# Patient Record
Sex: Female | Born: 1990 | Race: Black or African American | Hispanic: No | Marital: Married | Smoking: Current some day smoker
Health system: Southern US, Community
[De-identification: ages and names within clinical notes are randomized; demographics above are authoritative.]

## PROBLEM LIST (undated history)

## (undated) ENCOUNTER — Inpatient Hospital Stay (HOSPITAL_COMMUNITY): Payer: Self-pay

## (undated) DIAGNOSIS — F32A Depression, unspecified: Secondary | ICD-10-CM

## (undated) DIAGNOSIS — F329 Major depressive disorder, single episode, unspecified: Secondary | ICD-10-CM

## (undated) HISTORY — PX: NO PAST SURGERIES: SHX2092

## (undated) HISTORY — DX: Major depressive disorder, single episode, unspecified: F32.9

## (undated) HISTORY — DX: Depression, unspecified: F32.A

---

## 2015-07-09 ENCOUNTER — Emergency Department (HOSPITAL_COMMUNITY)
Admission: EM | Admit: 2015-07-09 | Discharge: 2015-07-09 | Disposition: A | Discharge status: Home / Self Care | Payer: Self-pay | Attending: Emergency Medicine | Admitting: Emergency Medicine

## 2015-07-09 ENCOUNTER — Encounter (HOSPITAL_COMMUNITY): Payer: Self-pay | Admitting: Emergency Medicine

## 2015-07-09 DIAGNOSIS — R451 Restlessness and agitation: Secondary | ICD-10-CM | POA: Insufficient documentation

## 2015-07-09 DIAGNOSIS — F419 Anxiety disorder, unspecified: Secondary | ICD-10-CM | POA: Insufficient documentation

## 2015-07-09 NOTE — ED Provider Notes (Signed)
CSN: [id number]     Arrival date & time 07/09/15  [id number] History   First MD Initiated Contact with Patient 07/09/15 0848     Chief Complaint  Patient presents with  . eval of feelings      (Consider location/radiation/quality/duration/timing/severity/associated sxs/prior Treatment) Patient is a 24 y.o. female presenting with altered mental status. The history is provided by the patient (Patient states that she has been anxious dealing with her husband.).  Altered Mental Status Presenting symptoms: no behavior changes and no combativeness   Severity:  Mild Most recent episode:  Today Timing:  Constant Progression:  Waxing and waning Chronicity:  New Context: not alcohol use   Associated symptoms: agitation   Associated symptoms: no abdominal pain, no hallucinations, no headaches, no rash and no seizures     History reviewed. No pertinent past medical history. History reviewed. No pertinent past surgical history. No family history on file. Social History  Substance Use Topics  . Smoking status: Never Smoker   . Smokeless tobacco: None  . Alcohol Use: No   OB History    No data available     Review of Systems  Constitutional: Negative for appetite change and fatigue.  HENT: Negative for congestion, ear discharge and sinus pressure.   Eyes: Negative for discharge.  Respiratory: Negative for cough.   Cardiovascular: Negative for chest pain.  Gastrointestinal: Negative for abdominal pain and diarrhea.  Genitourinary: Negative for frequency and hematuria.  Musculoskeletal: Negative for back pain.  Skin: Negative for rash.  Neurological: Negative for seizures and headaches.  Psychiatric/Behavioral: Positive for agitation. Negative for hallucinations.      Allergies  Review of patient's allergies indicates no known allergies.  Home Medications   Prior to Admission medications   Not on File   BP 155/93 mmHg  Pulse 114  Temp(Src) 98.6 F (37 C) (Oral)  Resp 20   SpO2 99%  LMP 06/25/2015 Physical Exam  Constitutional: She is oriented to person, place, and time. She appears well-developed.  HENT:  Head: Normocephalic.  Eyes: Conjunctivae and EOM are normal. No scleral icterus.  Neck: Neck supple. No thyromegaly present.  Cardiovascular: Normal rate and regular rhythm.  Exam reveals no gallop and no friction rub.   No murmur heard. Pulmonary/Chest: No stridor. She has no wheezes. She has no rales. She exhibits no tenderness.  Abdominal: She exhibits no distension. There is no tenderness. There is no rebound.  Musculoskeletal: Normal range of motion. She exhibits no edema.  Lymphadenopathy:    She has no cervical adenopathy.  Neurological: She is oriented to person, place, and time. She exhibits normal muscle tone. Coordination normal.  Skin: No rash noted. No erythema.  Psychiatric:  Anxiety mild. Patient not suicidal or homicidal    ED Course  Procedures (including critical care time) Labs Review Labs Reviewed - No data to display  Imaging Review No results found. I have personally reviewed and evaluated these images and lab results as part of my medical decision-making.   EKG Interpretation None      MDM   Final diagnoses:  Anxiety    Patient with anxiety. She will be referred to behavioral outpatient. Patient's husband is having a manic episode and will be evaluated    Bethann BerkshireJoseph Mazin Emma, MD 07/09/15 [phone]

## 2015-07-09 NOTE — ED Notes (Signed)
Patient is here wanting evaluation of feelings-husband is here for eval as well-states husband wants to save the world and it is causing her confusion

## 2015-07-09 NOTE — Discharge Instructions (Signed)
Follow up with out pt counseling Substance Abuse Treatment Programs  Intensive Outpatient Programs [facility]     [address]      [place], [place]                   [phone]       [facility] [address] [place], [place] [phone]  [facility]     (Inpatient and outpatient)     [address]           [phone]    [facility] [phone] (Suboxone and Methadone)  [address]      [place], [place] [address]      [phone]       [address] [place], [place] [phone]  [facility] (Outpatient/Inpatient, Chemical)    (insurance only) [phone]             [facility] (Groups & Residential) [place], [place] [phone]     [facility]     [address]     [place], [place]      [phone]       [facility] (for caregivers and family) [phone] [address] [place], [place] [phone]      Residential Treatment Programs [facility]      [address], [place], [place] [address]  [phone]       [facility] [address] [place], [place] [address] [phone]  [facility]        [phone]       [facility] [phone]  [facility] ([facility]             [address]                                         [place], [place]                                                [phone] or [phone]                               [facility] [place] [address] [place], [address] [phone]  [facility]    [address]      [place], [place]     [phone]       [facility] [address] [place], [place] [phone]  [facility]   [address]     [place], [place] [address]     [phone]      Admissions: 8am-3pm M-F  [facility] ([facility]) [address] [place], [place] [phone]  [facility]: Residential Program [phone](90 Days)   [place], [place]      [phone] or [phone]     [facility]: [facility] [place], [place] (Walk in Hours over the weekend or by referral)  [facility] [address] [place], [place], [place] [address] [phone]  Crisis Mobile: [facility]:  [phone] (for crisis response 24 hours a day) [facility] Hotline:      ([phone] Outpatient Psychiatry and  Counseling  [facility]: Mobile Crisis Management 24 hours:  [phone]  [facility] of the [employer] sliding scale fee and walk in schedule: M-F 8am-12pm/1pm-3pm [address]  [place], [place] [address] [phone]  [facility] [address] [place], [place] [address] [phone]  [facility] ([facility] [employer])- new patient walk-in appointments available Monday - Friday 8am -3pm.          [address] [place], [place] [address] [phone] or crisis line- [phone]  [facility]/ Intensive Outpatient Therapy Program [address] [place], [place] [address] [phone]  [facility]                  Crisis Services      [phone] [address]     [place], [place] [address]                 [place] [facility]   [facility] [phone]. [address] [place], [place] [address]   [employer] of Care          [address] [place]  [place], [place] [address]       [phone]  [facility] [address] [place], [place] [address] [phone]  [facility]    [address]    [place], [place] [address]     [phone]       Andee Poles, MD     [address]     [place] [address]     [phone]       [facility] [address] [place] [address]  [facility]     [address] [place], [place]      [phone]       [facility] E[facility]io Ditch, MD [address] [place], [place] [address] [phone]  [facility]     [address]     [place], [place] [address]     [phone]       [facility] [address] [place], [place] [address] [phone] Resources for Temporary Residential Assistance/Crisis Centers  DAY CENTERS [facility]) M-F 8am-3pm   [address] [place], [place] [address]   [phone] Services include: laundry, barbering, support groups, case management, phone  & computer access, showers, AA/NA mtgs, mental health/substance abuse nurse, job skills class, disability information, VA assistance, spiritual classes, etc.   HOMELESS SHELTERS  [facility]     [employer] [facility]   [address] [place]     [phone]              [employer] (women and children)       [address] [place], [place] [address] [phone] [email or web address] for application and process Application Required  [facility] [employer] [facility]   [address]    [place] [address]     [phone]                    [facility] [address] [place], [place] [address] [phone] application appt.) Application Required  [facility] (women only)    [address]     [place], [place] [address]  [phone]      Intake starts 6pm daily Need valid ID, SSC, & Police report [employer] [address] [place], [place] [phone] Application Required  [employer] (men only)     [place].      [place], [place]       [place] (Pregnant women only) [address] [place],  [place]  [facility]      [place] [address]      [place], [place] [address]     [phone]             [facility] [address] [place], [place] 90 day commitment/SA/Application process  [facility](men only)     [address]     [place], [place]       Check-in at [facility] [address] [place], [place] [address] [phone] Men/Women/Women and Children must be there by 7 pm  [place], [place]

## 2016-08-16 NOTE — L&D Delivery Note (Cosign Needed)
Delivery Note Pt began to bear down involuntarily and at approx 0312 she was noted to be complete with membrane present through the introitus. She pushed with the next two contractions and at 3:21 AM a viable female was delivered via Vaginal, Spontaneous Delivery (Presentation: en caul; ROP).  APGAR: 8,9 ; weight 5 lb 1.1 oz (2300 g).  Infant briefly dried; cord clamped and cut by CNM and infant to awaiting peds team. Placenta status: spont, intact .  Cord: 3 vessel  Anesthesia:  None Episiotomy: None Lacerations: None Est. Blood Loss (mL): 200  Mom to postpartum.  Baby to NICU.  Jamie Stevenson, Jamie Stevenson CNM 09/11/2016, 3:40 AM

## 2016-08-18 ENCOUNTER — Encounter (HOSPITAL_COMMUNITY): Payer: Self-pay | Admitting: *Deleted

## 2016-08-18 ENCOUNTER — Inpatient Hospital Stay (HOSPITAL_COMMUNITY)
Admission: AD | Admit: 2016-08-18 | Discharge: 2016-08-18 | Disposition: A | Discharge status: Home / Self Care | Payer: Self-pay | Source: Ambulatory Visit | Attending: Obstetrics & Gynecology | Admitting: Obstetrics & Gynecology

## 2016-08-18 DIAGNOSIS — Z711 Person with feared health complaint in whom no diagnosis is made: Secondary | ICD-10-CM

## 2016-08-18 DIAGNOSIS — O0933 Supervision of pregnancy with insufficient antenatal care, third trimester: Secondary | ICD-10-CM

## 2016-08-18 DIAGNOSIS — Z3A29 29 weeks gestation of pregnancy: Secondary | ICD-10-CM | POA: Insufficient documentation

## 2016-08-18 DIAGNOSIS — O99333 Smoking (tobacco) complicating pregnancy, third trimester: Secondary | ICD-10-CM | POA: Insufficient documentation

## 2016-08-18 LAB — URINALYSIS, ROUTINE W REFLEX MICROSCOPIC
Bilirubin Urine: NEGATIVE
Glucose, UA: NEGATIVE mg/dL
Ketones, ur: NEGATIVE mg/dL
Leukocytes, UA: NEGATIVE
Nitrite: NEGATIVE
PROTEIN: 100 mg/dL — AB
SPECIFIC GRAVITY, URINE: 1.016 (ref 1.005–1.030)
pH: 7 (ref 5.0–8.0)

## 2016-08-18 MED ORDER — PRENATAL VITAMINS 0.8 MG PO TABS: 1.0000 | ORAL_TABLET | Freq: Every day | ORAL | 12 refills | Status: AC

## 2016-08-18 NOTE — MAU Note (Signed)
Waiting on ins, has not been seen anywhere yet.  Not having any problems. Wanting to get checked out.

## 2016-08-18 NOTE — MAU Provider Note (Signed)
Chief Complaint:  check up   First Provider Initiated Contact with Patient 08/18/16 1925     HPI: Jamie Stevenson is a 26 y.o. G2P1001 at 3129w0dwho presents to maternity admissions reporting pregnancy with no prenatal care yet.  Wants to make sure baby is OK.  Unsure of dates. Had IC in June and not again until August.  Thinks she got pregnant in August. Seems surprised it could have been June.  Took Morning after pill in June.. She reports good fetal movement, denies LOF, vaginal bleeding, vaginal itching/burning, urinary symptoms, h/a, dizziness, n/v, diarrhea, constipation or fever/chills.  She denies headache, visual changes or RUQ abdominal pain.  Other  This is a new problem. The current episode started more than 1 month ago. The problem has been unchanged. Pertinent negatives include no abdominal pain, change in bowel habit, chest pain, chills, congestion, coughing, fatigue, fever, myalgias, nausea, urinary symptoms, vertigo, visual change or vomiting. Nothing aggravates the symptoms. She has tried nothing for the symptoms.   RN Note: Waiting on ins, has not been seen anywhere yet.  Not having any problems. Wanting to get checked out  Past Medical History: History reviewed. No pertinent past medical history.  Past obstetric history: OB History  Gravida Para Term Preterm AB Living  2 1 1     1   SAB TAB Ectopic Multiple Live Births          1    # Outcome Date GA Lbr Len/2nd Weight Sex Delivery Anes PTL Lv  2 Current           1 Term 2010     Vag-Spont   LIV      Past Surgical History: History reviewed. No pertinent surgical history.  Family History: History reviewed. No pertinent family history.  Social History: Social History  Substance Use Topics  . Smoking status: Current Every Day Smoker    Types: Cigarettes  . Smokeless tobacco: Former NeurosurgeonUser  . Alcohol use No    Allergies: No Known Allergies  Meds:  No prescriptions prior to admission.    I have reviewed  patient's Past Medical Hx, Surgical Hx, Family Hx, Social Hx, medications and allergies.   ROS:  Review of Systems  Constitutional: Negative for chills, fatigue and fever.  HENT: Negative for congestion.   Respiratory: Negative for cough.   Cardiovascular: Negative for chest pain.  Gastrointestinal: Negative for abdominal pain, change in bowel habit, nausea and vomiting.  Musculoskeletal: Negative for myalgias.  Neurological: Negative for vertigo.   Other systems negative  Physical Exam  Patient Vitals for the past 24 hrs:  BP Temp Temp src Pulse Resp SpO2 Weight  08/18/16 1855 - - - 106 - 99 % -  08/18/16 1853 124/72 98.3 F (36.8 C) Oral (!) 127 16 - 133 lb 6.4 oz (60.5 kg)   Constitutional: Well-developed, well-nourished female in no acute distress.  Cardiovascular: normal rate and rhythm Respiratory: normal effort, clear to auscultation bilaterally GI: Abd soft, non-tender, gravid appropriate for gestational age.   No rebound or guarding.       Fundal height 28cm MS: Extremities nontender, no edema, normal ROM Neurologic: Alert and oriented x 4.  GU: Neg CVAT.  PELVIC EXAM:  not indicated  FHT:  Baseline 140 , moderate variability, accelerations present, no decelerations Contractions:  Rare   Labs:    Results for orders placed or performed during the hospital encounter of 08/18/16 (from the past 72 hour(s))  Urinalysis, Routine w reflex microscopic  Status: Abnormal   Collection Time: 08/18/16  6:55 PM  Result Value Ref Range   Color, Urine YELLOW YELLOW   APPearance CLOUDY (A) CLEAR   Specific Gravity, Urine 1.016 1.005 - 1.030   pH 7.0 5.0 - 8.0   Glucose, UA NEGATIVE NEGATIVE mg/dL   Hgb urine dipstick MODERATE (A) NEGATIVE   Bilirubin Urine NEGATIVE NEGATIVE   Ketones, ur NEGATIVE NEGATIVE mg/dL   Protein, ur 409 (A) NEGATIVE mg/dL   Nitrite NEGATIVE NEGATIVE   Leukocytes, UA NEGATIVE NEGATIVE   RBC / HPF 6-30 0 - 5 RBC/hpf   WBC, UA 6-30 0 - 5 WBC/hpf    Bacteria, UA RARE (A) NONE SEEN   Squamous Epithelial / LPF 0-5 (A) NONE SEEN   Mucous PRESENT      Imaging:  Informal bedside US done Head circumference and abdominal circumference done which measure 28-29 weeks  MAU Course/MDM: I have ordered labs and reviewed results. UA normal  Other labs to be done at new ob appt NST reviewed   Assessment: Single IUP at [redacted]w[redacted]d No prenatal care No obvious abnormalities  Plan: Discharge home Preterm Labor precautions and fetal kick counts Follow up in Office for prenatal visits   Encouraged to return here or to other Urgent Care/ED if she develops worsening of symptoms, increase in pain, fever, or other concerning symptoms.   Pt stable at time of discharge.  Wynelle Bourgeois CNM, MSN Certified Nurse-Midwife 08/18/2016 7:25 PM

## 2016-08-18 NOTE — Discharge Instructions (Signed)
Prenatal Care WHAT IS PRENATAL CARE? Prenatal care is the process of caring for a pregnant woman before she gives birth. Prenatal care makes sure that she and her baby remain as healthy as possible throughout pregnancy. Prenatal care may be provided by a midwife, family practice health care provider, or a childbirth and pregnancy specialist (obstetrician). Prenatal care may include physical examinations, testing, treatments, and education on nutrition, lifestyle, and social support services. WHY IS PRENATAL CARE SO IMPORTANT? Early and consistent prenatal care increases the chance that you and your baby will remain healthy throughout your pregnancy. This type of care also decreases a babys risk of being born too early (prematurely), or being born smaller than expected (small for gestational age). Any underlying medical conditions you may have that could pose a risk during your pregnancy are discussed during prenatal care visits. You will also be monitored regularly for any new conditions that may arise during your pregnancy so they can be treated quickly and effectively. WHAT HAPPENS DURING PRENATAL CARE VISITS? Prenatal care visits may include the following: Discussion Tell your health care provider about any new signs or symptoms you have experienced since your last visit. These might include:  Nausea or vomiting.  Increased or decreased level of energy.  Difficulty sleeping.  Back or leg pain.  Weight changes.  Frequent urination.  Shortness of breath with physical activity.  Changes in your skin, such as the development of a rash or itchiness.  Vaginal discharge or bleeding.  Feelings of excitement or nervousness.  Changes in your babys movements. You may want to write down any questions or topics you want to discuss with your health care provider and bring them with you to your appointment. Examination During your first prenatal care visit, you will likely have a complete  physical exam. Your health care provider will often examine your vagina, cervix, and the position of your uterus, as well as check your heart, lungs, and other body systems. As your pregnancy progresses, your health care provider will measure the size of your uterus and your babys position inside your uterus. He or she may also examine you for early signs of labor. Your prenatal visits may also include checking your blood pressure and, after about 10-12 weeks of pregnancy, listening to your babys heartbeat. Testing Regular testing often includes:  Urinalysis. This checks your urine for glucose, protein, or signs of infection.  Blood count. This checks the levels of white and red blood cells in your body.  Tests for sexually transmitted infections (STIs). Testing for STIs at the beginning of pregnancy is routinely done and is required in many states.  Antibody testing. You will be checked to see if you are immune to certain illnesses, such as rubella, that can affect a developing fetus.  Glucose screen. Around 24-28 weeks of pregnancy, your blood glucose level will be checked for signs of gestational diabetes. Follow-up tests may be recommended.  Group B strep. This is a bacteria that is commonly found inside a womans vagina. This test will inform your health care provider if you need an antibiotic to reduce the amount of this bacteria in your body prior to labor and childbirth.  Ultrasound. Many pregnant women undergo an ultrasound screening around 18-20 weeks of pregnancy to evaluate the health of the fetus and check for any developmental abnormalities.  HIV (human immunodeficiency virus) testing. Early in your pregnancy, you will be screened for HIV. If you are at high risk for HIV, this test may be  repeated during your third trimester of pregnancy. You may be offered other testing based on your age, personal or family medical history, or other factors. HOW OFTEN SHOULD I PLAN TO SEE MY  HEALTH CARE PROVIDER FOR PRENATAL CARE? Your prenatal care check-up schedule depends on any medical conditions you have before, or develop during, your pregnancy. If you do not have any underlying medical conditions, you will likely be seen for checkups:  Monthly, during the first 6 months of pregnancy.  Twice a month during months 7 and 8 of pregnancy.  Weekly starting in the 9th month of pregnancy and until delivery. If you develop signs of early labor or other concerning signs or symptoms, you may need to see your health care provider more often. Ask your health care provider what prenatal care schedule is best for you. WHAT CAN I DO TO KEEP MYSELF AND MY BABY AS HEALTHY AS POSSIBLE DURING MY PREGNANCY?  Take a prenatal vitamin containing 400 micrograms (0.4 mg) of folic acid every day. Your health care provider may also ask you to take additional vitamins such as iodine, vitamin D, iron, copper, and zinc.  Take 1500-2000 mg of calcium daily starting at your 20th week of pregnancy until you deliver your baby.  Make sure you are up to date on your vaccinations. Unless directed otherwise by your health care provider:  You should receive a tetanus, diphtheria, and pertussis (Tdap) vaccination between the 27th and 36th week of your pregnancy, regardless of when your last Tdap immunization occurred. This helps protect your baby from whooping cough (pertussis) after he or she is born.  You should receive an annual inactivated influenza vaccine (IIV) to help protect you and your baby from influenza. This can be done at any point during your pregnancy.  Eat a well-rounded diet that includes:  Fresh fruits and vegetables.  Lean proteins.  Calcium-rich foods such as milk, yogurt, hard cheeses, and dark, leafy greens.  Whole grain breads.  Do noteat seafood high in mercury, including:  Swordfish.  Tilefish.  Shark.  King mackerel.  More than 6 oz tuna per week.  Do not eat:  Raw  or undercooked meats or eggs.  Unpasteurized foods, such as soft cheeses (brie, blue, or feta), juices, and milks.  Lunch meats.  Hot dogs that have not been heated until they are steaming.  Drink enough water to keep your urine clear or pale yellow. For many women, this may be 10 or more 8 oz glasses of water each day. Keeping yourself hydrated helps deliver nutrients to your baby and may prevent the start of pre-term uterine contractions.  Do not use any tobacco products including cigarettes, chewing tobacco, or electronic cigarettes. If you need help quitting, ask your health care provider.  Do not drink beverages containing alcohol. No safe level of alcohol consumption during pregnancy has been determined.  Do not use any illegal drugs. These can harm your developing baby or cause a miscarriage.  Ask your health care provider or pharmacist before taking any prescription or over-the-counter medicines, herbs, or supplements.  Limit your caffeine intake to no more than 200 mg per day.  Exercise. Unless told otherwise by your health care provider, try to get 30 minutes of moderate exercise most days of the week. Do not  do high-impact activities, contact sports, or activities with a high risk of falling, such as horseback riding or downhill skiing.  Get plenty of rest.  Avoid anything that raises your body temperature, such  as hot tubs and saunas.  If you own a cat, do not empty its litter box. Bacteria contained in cat feces can cause an infection called toxoplasmosis. This can result in serious harm to the fetus.  Stay away from chemicals such as insecticides, lead, mercury, and cleaning or paint products that contain solvents.  Do not have any X-rays taken unless medically necessary.  Take a childbirth and breastfeeding preparation class. Ask your health care provider if you need a referral or recommendation. This information is not intended to replace advice given to you by your  health care provider. Make sure you discuss any questions you have with your health care provider. Document Released: 08/05/2003 Document Revised: 01/05/2016 Document Reviewed: 10/17/2013 Elsevier Interactive Patient Education  2017 [employer]. Third Trimester of Pregnancy The third trimester is from week 29 through week 42, months 7 through 9. This trimester is when your unborn baby (fetus) is growing very fast. At the end of the ninth month, the unborn baby is about 20 inches in length. It weighs about 6-10 pounds. Follow these instructions at home:  Avoid all smoking, herbs, and alcohol. Avoid drugs not approved by your doctor.  Do not use any tobacco products, including cigarettes, chewing tobacco, and electronic cigarettes. If you need help quitting, ask your doctor. You may get counseling or other support to help you quit.  Only take medicine as told by your doctor. Some medicines are safe and some are not during pregnancy.  Exercise only as told by your doctor. Stop exercising if you start having cramps.  Eat regular, healthy meals.  Wear a good support bra if your breasts are tender.  Do not use hot tubs, steam rooms, or saunas.  Wear your seat belt when driving.  Avoid raw meat, uncooked cheese, and liter boxes and soil used by cats.  Take your prenatal vitamins.  Take 1500-2000 milligrams of calcium daily starting at the 20th week of pregnancy until you deliver your baby.  Try taking medicine that helps you poop (stool softener) as needed, and if your doctor approves. Eat more fiber by eating fresh fruit, vegetables, and whole grains. Drink enough fluids to keep your pee (urine) clear or pale yellow.  Take warm water baths (sitz baths) to soothe pain or discomfort caused by hemorrhoids. Use hemorrhoid cream if your doctor approves.  If you have puffy, bulging veins (varicose veins), wear support hose. Raise (elevate) your feet for 15 minutes, 3-4 times a day. Limit salt  in your diet.  Avoid heavy lifting, wear low heels, and sit up straight.  Rest with your legs raised if you have leg cramps or low back pain.  Visit your dentist if you have not gone during your pregnancy. Use a soft toothbrush to brush your teeth. Be gentle when you floss.  You can have sex (intercourse) unless your doctor tells you not to.  Do not travel far distances unless you must. Only do so with your doctor's approval.  Take prenatal classes.  Practice driving to the hospital.  Pack your hospital bag.  Prepare the baby's room.  Go to your doctor visits. Get help if:  You are not sure if you are in labor or if your water has broken.  You are dizzy.  You have mild cramps or pressure in your lower belly (abdominal).  You have a nagging pain in your belly area.  You continue to feel sick to your stomach (nauseous), throw up (vomit), or have watery poop (diarrhea).  You have bad smelling fluid coming from your vagina.  You have pain with peeing (urination). Get help right away if:  You have a fever.  You are leaking fluid from your vagina.  You are spotting or bleeding from your vagina.  You have severe belly cramping or pain.  You lose or gain weight rapidly.  You have trouble catching your breath and have chest pain.  You notice sudden or extreme puffiness (swelling) of your face, hands, ankles, feet, or legs.  You have not felt the baby move in over an hour.  You have severe headaches that do not go away with medicine.  You have vision changes. This information is not intended to replace advice given to you by your health care provider. Make sure you discuss any questions you have with your health care provider. Document Released: 10/27/2009 Document Revised: 01/08/2016 Document Reviewed: 10/03/2012 Elsevier Interactive Patient Education  2017 [employer]

## 2016-08-23 ENCOUNTER — Encounter: Payer: Self-pay | Admitting: Advanced Practice Midwife

## 2016-08-23 ENCOUNTER — Encounter: Payer: Self-pay | Admitting: General Practice

## 2016-09-09 ENCOUNTER — Ambulatory Visit (INDEPENDENT_AMBULATORY_CARE_PROVIDER_SITE_OTHER): Payer: No Typology Code available for payment source | Admitting: Student

## 2016-09-09 ENCOUNTER — Encounter: Payer: Self-pay | Admitting: Student

## 2016-09-09 ENCOUNTER — Ambulatory Visit (INDEPENDENT_AMBULATORY_CARE_PROVIDER_SITE_OTHER): Payer: Self-pay | Admitting: Clinical

## 2016-09-09 VITALS — BP 124/77 | HR 115 | Ht 61.0 in | Wt 131.6 lb

## 2016-09-09 DIAGNOSIS — F329 Major depressive disorder, single episode, unspecified: Secondary | ICD-10-CM | POA: Insufficient documentation

## 2016-09-09 DIAGNOSIS — Z113 Encounter for screening for infections with a predominantly sexual mode of transmission: Secondary | ICD-10-CM

## 2016-09-09 DIAGNOSIS — F322 Major depressive disorder, single episode, severe without psychotic features: Secondary | ICD-10-CM

## 2016-09-09 DIAGNOSIS — Z3483 Encounter for supervision of other normal pregnancy, third trimester: Secondary | ICD-10-CM | POA: Diagnosis not present

## 2016-09-09 DIAGNOSIS — Z3490 Encounter for supervision of normal pregnancy, unspecified, unspecified trimester: Secondary | ICD-10-CM | POA: Insufficient documentation

## 2016-09-09 LAB — POCT URINALYSIS DIP (DEVICE)
BILIRUBIN URINE: NEGATIVE
Glucose, UA: NEGATIVE mg/dL
Ketones, ur: NEGATIVE mg/dL
Leukocytes, UA: NEGATIVE
NITRITE: NEGATIVE
Protein, ur: 100 mg/dL — AB
Specific Gravity, Urine: 1.02 (ref 1.005–1.030)
Urobilinogen, UA: 2 mg/dL — ABNORMAL HIGH (ref 0.0–1.0)
pH: 7 (ref 5.0–8.0)

## 2016-09-09 NOTE — Progress Notes (Signed)
   Subjective:    Jamie LernerDeja Stevenson is a G3P1011 at 1567w1d by LMP being seen today for her first obstetrical visit.  Her obstetrical history is significant for late to prenatal care. Patient does intend to breast feed. Pregnancy history fully reviewed.  Patient reports no complaints. Patient is teary and anxious and asking to speak to Jamie Stevenson when her OB visit is over.   Vitals:   09/09/16 1452 09/09/16 1457  BP: 124/77   Pulse: (!) 115   Weight: 131 lb 9.6 oz (59.7 kg)   Height:  5\' 1"  (1.549 m)    HISTORY: OB History  Gravida Para Term Preterm AB Living  3 1 1   1 1   SAB TAB Ectopic Multiple Live Births          1    # Outcome Date GA Lbr Len/2nd Weight Sex Delivery Anes PTL Lv  3 Current           2 AB 2014          1 Term 2010     Vag-Spont   LIV     Past Medical History:  Diagnosis Date  . Depression    No past surgical history on file. Family History  Problem Relation Age of Onset  . Diabetes Mother      Exam    Uterus:  Fundal Height: 32 cm  Pelvic Exam:    Perineum: No Hemorrhoids   Vulva: normal   Vagina:  normal mucosa, normal discharge   pH:    Cervix: long, closed, thick. No CMT. No lesions.    Adnexa: normal adnexa   Bony Pelvis: average  System: Breast:  normal appearance, no masses or tenderness   Skin: normal coloration and turgor, no rashes    Neurologic: oriented, normal, normal mood   Extremities: normal strength, tone, and muscle mass   HEENT PERRLA   Mouth/Teeth mucous membranes moist, pharynx normal without lesions   Neck supple   Cardiovascular: regular rate and rhythm   Respiratory:  appears well, vitals normal, no respiratory distress, acyanotic, normal RR, ear and throat exam is normal, neck free of mass or lymphadenopathy, chest clear, no wheezing, crepitations, rhonchi, normal symmetric air entry   Abdomen: soft, non-tender; bowel sounds normal; no masses,  no organomegaly   Urinary: urethral meatus normal      Assessment:    Pregnancy: [id number] Patient Active Problem List   Diagnosis Date Noted  . Supervision of low-risk pregnancy 09/09/2016  . Depression, major, single episode 09/09/2016        Plan:     Initial labs drawn. 2 hour GTT scheduled for 09/14/2016. Prenatal vitamins. Problem list reviewed and updated. Genetic Screening discussed : too late to care.  Ultrasound discussed; fetal survey: scheduled for 09/16/2016.  Follow up in 2 weeks. Patient spoke with Jamie Stevenson at this visit, and will see Jamie Stevenson in two weeks as well to discuss interpersonal conflict and anxiety around pregnancy and husband's reaction to her gestational age.  50% of 30 min visit spent on counseling and coordination of care.    Charlesetta GaribaldiKathryn [facility] 09/09/2016

## 2016-09-09 NOTE — Progress Notes (Signed)
Here for initial prenatal visit. C/o being sick for about a week with a mostly dry cough, congestion, headaches,but feels like it is getting better. Declines flu and tdap shots until next visit when not sick.  Signed up for [employer] App/

## 2016-09-09 NOTE — BH Specialist Note (Signed)
Session Start time: 4:00   End Time: 4:30 Total Time:  30 minutes Type of Service: Behavioral Health - Individual/Family Interpreter: No.   Interpreter Name & Language: n/a # [facility]  SUBJECTIVE: Jamie Stevenson is a 26 y.o. female  Pt. was referred by Jamie KitchensKathryn Stevenson, CNM for:  anxiety and depression. Pt. reports the following symptoms/concerns: Pt states her primary concern is worry over husband not knowing that he is not father of her baby; open to strategy to cope with overwhelming stress and lack of sleep until next visit. Pt also concern for husband, who is not being treated for schizophrenia. Duration of problem: Less than 6 months Severity: severe Previous treatment: none  OBJECTIVE: Mood: Anxious and Depressed & Affect: Tearful Risk of harm to self or others: No known risk of harm to self or others, pt denies SI today, pt admits fleeting thoughts of dying, but not suicidal thoughts; no HI, no plan Assessments administered: PHQ9: 24/ GAD7: 21  LIFE CONTEXT:  Family & Social: Lives with husband and 7yo; supportive family  (Who,family proximity, relationship, friends) Product/process development scientistchool/ Work: undetermined (Where, how often, or financial support) Self-Care: Fear and worry have prevented self-care (Exercise, sleep, eat, substances) Life changes: Current pregnancy, reunited with husband in past 4.5 months What is important to pt/family (values): Overall wellbeing of entire family  GOALS ADDRESSED:  -Reduce symptoms of anxiety and depression -Safety  INTERVENTIONS: Solution Focused and Meditation: CALM relaxation breathing exercise   ASSESSMENT:  Pt currently experiencing Major depressive disorder, without psychotic features, severe.  Pt may benefit from community resources, safety planning, psychoeducation and brief therapeutic intervention regarding coping with symptoms of anxiety with panic attack and anxiety.   PLAN: 1. F/U with behavioral health clinician:  Two weeks, or earlier, as needed 2. Behavioral Health meds: none 3. Behavioral recommendations:  -Practice CALM relaxation breathing exercise twice daily, at breakfast, and at bedtime -Consider apps provided in office visit for additional self-care strategies -Consider [facility] family-to-family classes within one month -Consider [facility] for individual, couples, and family therapy for self and husband -Scientist, product/process developmentead educational material regarding coping with symptoms of anxiety with panic attack and depression 4. Referral: Brief Counseling/Psychotherapy, Publishing rights managerCommunity Resource, Problem-solving teaching/coping strategies, Psychoeducation and Referral to [facility] provider 5. From scale of 1-10, how likely are you to follow plan: 9  Rae LipsJamie C Rubert Stevenson LCSWA Behavioral Health Clinician  Warmhandoff:   Warm Hand Off Completed.        Depression screen PHQ 2/9 09/09/2016  Decreased Interest 3  Down, Depressed, Hopeless 3  PHQ - 2 Score 6  Altered sleeping 3  Tired, decreased energy 3  Change in appetite 3  Feeling bad or failure about yourself  3  Trouble concentrating 2  Moving slowly or fidgety/restless 2  Suicidal thoughts 2  PHQ-9 Score 24   GAD 7 : Generalized Anxiety Score 09/09/2016  Nervous, Anxious, on Edge 3  Control/stop worrying 3  Worry too much - different things 3  Trouble relaxing 3  Restless 3  Easily annoyed or irritable 3  Afraid - awful might happen 3  Total GAD 7 Score 21

## 2016-09-10 ENCOUNTER — Encounter (HOSPITAL_COMMUNITY): Payer: Self-pay | Admitting: *Deleted

## 2016-09-10 ENCOUNTER — Inpatient Hospital Stay (HOSPITAL_COMMUNITY)
Admission: AD | Admit: 2016-09-10 | Discharge: 2016-09-13 | DRG: 775 | Disposition: A | Discharge status: Home / Self Care | Payer: No Typology Code available for payment source | Source: Ambulatory Visit | Attending: Obstetrics & Gynecology | Admitting: Obstetrics & Gynecology

## 2016-09-10 ENCOUNTER — Inpatient Hospital Stay (HOSPITAL_COMMUNITY): Payer: No Typology Code available for payment source

## 2016-09-10 DIAGNOSIS — J1100 Influenza due to unidentified influenza virus with unspecified type of pneumonia: Secondary | ICD-10-CM | POA: Diagnosis present

## 2016-09-10 DIAGNOSIS — J189 Pneumonia, unspecified organism: Secondary | ICD-10-CM

## 2016-09-10 DIAGNOSIS — O99334 Smoking (tobacco) complicating childbirth: Secondary | ICD-10-CM | POA: Diagnosis present

## 2016-09-10 DIAGNOSIS — J1000 Influenza due to other identified influenza virus with unspecified type of pneumonia: Secondary | ICD-10-CM | POA: Diagnosis present

## 2016-09-10 DIAGNOSIS — Z3A32 32 weeks gestation of pregnancy: Secondary | ICD-10-CM

## 2016-09-10 DIAGNOSIS — Z3A33 33 weeks gestation of pregnancy: Secondary | ICD-10-CM | POA: Diagnosis not present

## 2016-09-10 DIAGNOSIS — Z833 Family history of diabetes mellitus: Secondary | ICD-10-CM | POA: Diagnosis not present

## 2016-09-10 DIAGNOSIS — R0600 Dyspnea, unspecified: Secondary | ICD-10-CM

## 2016-09-10 DIAGNOSIS — O99513 Diseases of the respiratory system complicating pregnancy, third trimester: Secondary | ICD-10-CM

## 2016-09-10 DIAGNOSIS — R109 Unspecified abdominal pain: Secondary | ICD-10-CM | POA: Diagnosis present

## 2016-09-10 DIAGNOSIS — F17210 Nicotine dependence, cigarettes, uncomplicated: Secondary | ICD-10-CM | POA: Diagnosis present

## 2016-09-10 DIAGNOSIS — Z3490 Encounter for supervision of normal pregnancy, unspecified, unspecified trimester: Secondary | ICD-10-CM

## 2016-09-10 DIAGNOSIS — O9952 Diseases of the respiratory system complicating childbirth: Secondary | ICD-10-CM | POA: Diagnosis present

## 2016-09-10 DIAGNOSIS — Z3493 Encounter for supervision of normal pregnancy, unspecified, third trimester: Secondary | ICD-10-CM

## 2016-09-10 LAB — PRENATAL PROFILE (SOLSTAS)
Antibody Screen: NEGATIVE
BASOS ABS: 0 {cells}/uL (ref 0–200)
Basophils Relative: 0 %
EOS ABS: 0 {cells}/uL — AB (ref 15–500)
EOS PCT: 0 %
HCT: 31.3 % — ABNORMAL LOW (ref 35.0–45.0)
HEP B S AG: NEGATIVE
HIV 1&2 Ab, 4th Generation: NONREACTIVE
Hemoglobin: 10.6 g/dL — ABNORMAL LOW (ref 11.7–15.5)
LYMPHS ABS: 1485 {cells}/uL (ref 850–3900)
Lymphocytes Relative: 15 %
MCH: 31.8 pg (ref 27.0–33.0)
MCHC: 33.9 g/dL (ref 32.0–36.0)
MCV: 94 fL (ref 80.0–100.0)
MONO ABS: 891 {cells}/uL (ref 200–950)
MPV: 10.7 fL (ref 7.5–12.5)
Monocytes Relative: 9 %
NEUTROS PCT: 76 %
Neutro Abs: 7524 cells/uL (ref 1500–7800)
Platelets: 164 10*3/uL (ref 140–400)
RBC: 3.33 MIL/uL — ABNORMAL LOW (ref 3.80–5.10)
RDW: 14.2 % (ref 11.0–15.0)
RUBELLA: 1.79 {index} — AB (ref <0.90)
Rh Type: POSITIVE
WBC: 9.9 10*3/uL (ref 3.8–10.8)

## 2016-09-10 LAB — WET PREP, GENITAL
Clue Cells Wet Prep HPF POC: NONE SEEN
SPERM: NONE SEEN
TRICH WET PREP: NONE SEEN
YEAST WET PREP: NONE SEEN

## 2016-09-10 LAB — URINALYSIS, ROUTINE W REFLEX MICROSCOPIC
BILIRUBIN URINE: NEGATIVE
Glucose, UA: NEGATIVE mg/dL
Ketones, ur: 20 mg/dL — AB
Nitrite: NEGATIVE
PROTEIN: 100 mg/dL — AB
SPECIFIC GRAVITY, URINE: 1.008 (ref 1.005–1.030)
pH: 6 (ref 5.0–8.0)

## 2016-09-10 LAB — GC/CHLAMYDIA PROBE AMP (~~LOC~~) NOT AT ARMC
CHLAMYDIA, DNA PROBE: NEGATIVE
Neisseria Gonorrhea: NEGATIVE

## 2016-09-10 LAB — CULTURE, OB URINE: ORGANISM ID, BACTERIA: NO GROWTH

## 2016-09-10 MED ORDER — PENICILLIN G POTASSIUM 5000000 UNITS IJ SOLR
5.0000 10*6.[IU] | Freq: Once | INTRAVENOUS | Status: AC
  Administered 2016-09-11: 5 10*6.[IU] via INTRAVENOUS
  Filled 2016-09-10: qty 5

## 2016-09-10 MED ORDER — ACETAMINOPHEN 325 MG PO TABS
650.0000 mg | ORAL_TABLET | ORAL | Status: DC | PRN
Start: 2016-09-10 — End: 2016-09-11
  Administered 2016-09-11: 650 mg via ORAL
  Filled 2016-09-10: qty 2

## 2016-09-10 MED ORDER — LACTATED RINGERS IV BOLUS (SEPSIS)
1000.0000 mL | Freq: Once | INTRAVENOUS | Status: AC
  Administered 2016-09-10: 1000 mL via INTRAVENOUS

## 2016-09-10 MED ORDER — BETAMETHASONE SOD PHOS & ACET 6 (3-3) MG/ML IJ SUSP
12.5000 mg | Freq: Once | INTRAMUSCULAR | Status: DC
  Administered 2016-09-10: 12.5 mg via INTRAMUSCULAR
  Filled 2016-09-10: qty 2.1

## 2016-09-10 MED ORDER — MAGNESIUM SULFATE 50 % IJ SOLN
2.0000 g/h | INTRAVENOUS | Status: DC
  Administered 2016-09-11: 2 g/h via INTRAVENOUS
  Filled 2016-09-10: qty 80

## 2016-09-10 MED ORDER — PRENATAL MULTIVITAMIN CH
1.0000 | ORAL_TABLET | Freq: Every day | ORAL | Status: DC
  Filled 2016-09-10: qty 1

## 2016-09-10 MED ORDER — PENICILLIN G POTASSIUM 5000000 UNITS IJ SOLR
2.5000 10*6.[IU] | INTRAVENOUS | Status: DC
  Filled 2016-09-10 (×3): qty 2.5

## 2016-09-10 MED ORDER — MAGNESIUM SULFATE BOLUS VIA INFUSION
6.0000 g | Freq: Once | INTRAVENOUS | Status: AC
  Administered 2016-09-10: 6 g via INTRAVENOUS
  Filled 2016-09-10: qty 500

## 2016-09-10 MED ORDER — ONDANSETRON HCL 4 MG/2ML IJ SOLN
4.0000 mg | Freq: Once | INTRAMUSCULAR | Status: AC
  Administered 2016-09-10: 4 mg via INTRAVENOUS
  Filled 2016-09-10: qty 2

## 2016-09-10 MED ORDER — OSELTAMIVIR PHOSPHATE 75 MG PO CAPS
75.0000 mg | ORAL_CAPSULE | Freq: Two times a day (BID) | ORAL | Status: DC
  Administered 2016-09-11 – 2016-09-13 (×5): 75 mg via ORAL
  Filled 2016-09-10 (×9): qty 1

## 2016-09-10 MED ORDER — LACTATED RINGERS IV SOLN
INTRAVENOUS | Status: DC
  Administered 2016-09-10: 23:00:00 via INTRAVENOUS
  Administered 2016-09-11: 500 mL via INTRAVENOUS

## 2016-09-10 MED ORDER — NIFEDIPINE 10 MG PO CAPS
10.0000 mg | ORAL_CAPSULE | ORAL | Status: DC | PRN
  Administered 2016-09-10: 10 mg via ORAL
  Filled 2016-09-10: qty 1

## 2016-09-10 MED ORDER — ZOLPIDEM TARTRATE 5 MG PO TABS: 5.0000 mg | ORAL_TABLET | Freq: Every evening | ORAL | Status: DC | PRN

## 2016-09-10 MED ORDER — CALCIUM CARBONATE ANTACID 500 MG PO CHEW: 2.0000 | CHEWABLE_TABLET | ORAL | Status: DC | PRN

## 2016-09-10 MED ORDER — DOCUSATE SODIUM 100 MG PO CAPS
100.0000 mg | ORAL_CAPSULE | Freq: Every day | ORAL | Status: DC
  Filled 2016-09-10: qty 1

## 2016-09-10 NOTE — MAU Note (Signed)
PT  SAYS SHE STARTED HAVING UC  AT 130PM-   THEN BECAME STRONGER  AT 3 PM.    [facility]  SHE NOTICE  VAG BLEEDING   AT 8PM.   LAST SEX-  07-2016.

## 2016-09-10 NOTE — H&P (Signed)
Jamie LernerDeja Stevenson is a 26 y.o. female 673P1011 @ 32.2wks presenting for abd tightening all day that has increased recently. Also w/ vag bldg and diarrhea x 3 today. Reports a T of 100 at home today and a cough x one week and sl sore throat. No ear pain. Had her first prenatal visit @ [facility] on 1/25 with neg urine culture/GC/chlam; has not had an U/S this preg.   OB History    Gravida Para Term Preterm AB Living   3 1 1   1 1    SAB TAB Ectopic Multiple Live Births           1     Past Medical History:  Diagnosis Date  . Depression    Past Surgical History:  Procedure Laterality Date  . NO PAST SURGERIES     Family History: family history includes Diabetes in her mother. Social History:  reports that she has been smoking Cigarettes.  She has been smoking about 0.25 packs per day. She has never used smokeless tobacco. She reports that she uses drugs, including Marijuana. She reports that she does not drink alcohol.     Maternal Diabetes: No- not tested yet Genetic Screening: Declined too late Maternal Ultrasounds/Referrals: Normal Fetal Ultrasounds or other Referrals:  None Maternal Substance Abuse:  No Significant Maternal Medications:  None Significant Maternal Lab Results:  None Other Comments:  had first prenatal visit 1/25  ROS History Dilation: 2 Effacement (%): 70 Station: -3 Blood pressure 115/58, pulse 108, temperature 98.3 F (36.8 C), resp. rate 18, height 5\' 1"  (1.549 m), weight 59.8 kg (131 lb 12.8 oz), last menstrual period 01/28/2016. Exam Physical Exam  Constitutional: She is oriented to person, place, and time. She appears well-developed.  HENT:  Head: Normocephalic.  Mouth/Throat: No oropharyngeal exudate.  Sl prod cough resulting in vomiting  Neck: Normal range of motion.  Cardiovascular:  Sl tachycardic 108  Respiratory: Effort normal.  GI:  EFM 120s, avg LTV, variables to 90s at times Ctx q 3-5 mins  Genitourinary:  Genitourinary Comments: SSE: sm brownish  d/c with spot of red blood; Cx initially 2/70, then 4/70 approx an hour later  Musculoskeletal: Normal range of motion.  Neurological: She is alert and oriented to person, place, and time.  Skin: Skin is warm and dry.  Psychiatric: She has a normal mood and affect. Her behavior is normal. Thought content normal.    Urinalysis    Component Value Date/Time   COLORURINE YELLOW 09/10/2016 2100   APPEARANCEUR CLOUDY (A) 09/10/2016 2100   LABSPEC 1.008 09/10/2016 2100   PHURINE 6.0 09/10/2016 2100   GLUCOSEU NEGATIVE 09/10/2016 2100   HGBUR LARGE (A) 09/10/2016 2100   BILIRUBINUR NEGATIVE 09/10/2016 2100   KETONESUR 20 (A) 09/10/2016 2100   PROTEINUR 100 (A) 09/10/2016 2100   UROBILINOGEN 2.0 (H) 09/09/2016 1450   NITRITE NEGATIVE 09/10/2016 2100   LEUKOCYTESUR MODERATE (A) 09/10/2016 2100   Micro: rare bacteria, 6-30 SE  U/S: 33.4wk, ceph, ant plac, AFI 11.5cm, EFW 57%  Prenatal labs: ABO, Rh: O/POS/-- (01/25 1600) Antibody: NEG (01/25 1600) Rubella: 1.79 (01/25 1600) RPR: NON REAC (01/25 1600)  HBsAg: NEGATIVE (01/25 1600)  HIV: NONREACTIVE (01/25 1600)  GBS:   pending  Assessment/Plan: IUP@33 .4wks Preterm labor Flu s/s  Admit to L&D Mag sulfate/BMZ/PCN  Flu swab pending Growth U/S in MAU, but unable to complete the BPP portion due to pt coughing/movement   SHAW, KIMBERLY CNM 09/10/2016, 11:06 PM

## 2016-09-10 NOTE — Progress Notes (Signed)
Philipp DeputyKim Shaw CNM notified of pt's admission and status. Aware of bleeding, diarrhea, ctx, FHR flat and late decel with first ctx. Will see pt

## 2016-09-10 NOTE — MAU Note (Addendum)
Having abd cramping and spotting today. Spotting looks like period blood but not a lot. STates took temp an hour ago and was 101. Took ASA regular strength 3hrs ago. Nauesa today and 3 watery stools

## 2016-09-11 ENCOUNTER — Encounter (HOSPITAL_COMMUNITY): Payer: Self-pay

## 2016-09-11 ENCOUNTER — Inpatient Hospital Stay (HOSPITAL_COMMUNITY): Payer: No Typology Code available for payment source

## 2016-09-11 DIAGNOSIS — Z3A33 33 weeks gestation of pregnancy: Secondary | ICD-10-CM

## 2016-09-11 LAB — TYPE AND SCREEN
ABO/RH(D): O POS
Antibody Screen: NEGATIVE

## 2016-09-11 LAB — RAPID URINE DRUG SCREEN, HOSP PERFORMED
AMPHETAMINES: NOT DETECTED
BENZODIAZEPINES: NOT DETECTED
Barbiturates: NOT DETECTED
Cocaine: NOT DETECTED
OPIATES: NOT DETECTED
Tetrahydrocannabinol: NOT DETECTED

## 2016-09-11 LAB — INFLUENZA PANEL BY PCR (TYPE A & B)
INFLBPCR: NEGATIVE
Influenza A By PCR: POSITIVE — AB

## 2016-09-11 LAB — ABO/RH: ABO/RH(D): O POS

## 2016-09-11 MED ORDER — BENZOCAINE-MENTHOL 20-0.5 % EX AERO: 1.0000 "application " | INHALATION_SPRAY | CUTANEOUS | Status: DC | PRN

## 2016-09-11 MED ORDER — IBUPROFEN 600 MG PO TABS
600.0000 mg | ORAL_TABLET | Freq: Four times a day (QID) | ORAL | Status: DC
  Administered 2016-09-11 – 2016-09-13 (×10): 600 mg via ORAL
  Filled 2016-09-11 (×10): qty 1

## 2016-09-11 MED ORDER — SIMETHICONE 80 MG PO CHEW: 80.0000 mg | CHEWABLE_TABLET | ORAL | Status: DC | PRN

## 2016-09-11 MED ORDER — TETANUS-DIPHTH-ACELL PERTUSSIS 5-2.5-18.5 LF-MCG/0.5 IM SUSP: 0.5000 mL | Freq: Once | INTRAMUSCULAR | Status: DC

## 2016-09-11 MED ORDER — WITCH HAZEL-GLYCERIN EX PADS: 1.0000 "application " | MEDICATED_PAD | CUTANEOUS | Status: DC | PRN

## 2016-09-11 MED ORDER — OXYTOCIN 40 UNITS IN LACTATED RINGERS INFUSION - SIMPLE MED
INTRAVENOUS | Status: AC
  Filled 2016-09-11: qty 1000

## 2016-09-11 MED ORDER — FENTANYL CITRATE (PF) 100 MCG/2ML IJ SOLN
100.0000 ug | INTRAMUSCULAR | Status: DC | PRN
  Administered 2016-09-11: 100 ug via INTRAVENOUS
  Filled 2016-09-11: qty 2

## 2016-09-11 MED ORDER — BETAMETHASONE SOD PHOS & ACET 6 (3-3) MG/ML IJ SUSP
12.5000 mg | INTRAMUSCULAR | Status: DC
Start: 2016-09-11 — End: 2016-09-11

## 2016-09-11 MED ORDER — ZOLPIDEM TARTRATE 5 MG PO TABS: 5.0000 mg | ORAL_TABLET | Freq: Every evening | ORAL | Status: DC | PRN

## 2016-09-11 MED ORDER — PRENATAL MULTIVITAMIN CH
1.0000 | ORAL_TABLET | Freq: Every day | ORAL | Status: DC
  Administered 2016-09-11 – 2016-09-13 (×3): 1 via ORAL
  Filled 2016-09-11 (×3): qty 1

## 2016-09-11 MED ORDER — ACETAMINOPHEN 325 MG PO TABS: 650.0000 mg | ORAL_TABLET | ORAL | Status: DC | PRN

## 2016-09-11 MED ORDER — BUTORPHANOL TARTRATE 1 MG/ML IJ SOLN: 2.0000 mg | INTRAMUSCULAR | Status: DC | PRN

## 2016-09-11 MED ORDER — COCONUT OIL OIL
1.0000 "application " | TOPICAL_OIL | Status: DC | PRN
  Filled 2016-09-11: qty 120

## 2016-09-11 MED ORDER — ONDANSETRON HCL 4 MG PO TABS: 4.0000 mg | ORAL_TABLET | ORAL | Status: DC | PRN

## 2016-09-11 MED ORDER — BENZONATATE 100 MG PO CAPS
200.0000 mg | ORAL_CAPSULE | Freq: Two times a day (BID) | ORAL | Status: DC | PRN
  Administered 2016-09-11 – 2016-09-12 (×4): 200 mg via ORAL
  Filled 2016-09-11 (×5): qty 2

## 2016-09-11 MED ORDER — SENNOSIDES-DOCUSATE SODIUM 8.6-50 MG PO TABS
2.0000 | ORAL_TABLET | ORAL | Status: DC
  Administered 2016-09-11 – 2016-09-12 (×2): 2 via ORAL
  Filled 2016-09-11 (×2): qty 2

## 2016-09-11 MED ORDER — CEFTRIAXONE SODIUM 2 G IJ SOLR
2.0000 g | INTRAMUSCULAR | Status: DC
  Administered 2016-09-11 – 2016-09-13 (×3): 2 g via INTRAVENOUS
  Filled 2016-09-11 (×3): qty 2

## 2016-09-11 MED ORDER — LIDOCAINE HCL (PF) 1 % IJ SOLN
INTRAMUSCULAR | Status: AC
  Filled 2016-09-11: qty 30

## 2016-09-11 MED ORDER — ONDANSETRON HCL 4 MG/2ML IJ SOLN: 4.0000 mg | INTRAMUSCULAR | Status: DC | PRN

## 2016-09-11 MED ORDER — PNEUMOCOCCAL VAC POLYVALENT 25 MCG/0.5ML IJ INJ
0.5000 mL | INJECTION | INTRAMUSCULAR | Status: DC
  Filled 2016-09-11: qty 0.5

## 2016-09-11 MED ORDER — OXYCODONE HCL 5 MG PO TABS: 5.0000 mg | ORAL_TABLET | ORAL | Status: DC | PRN

## 2016-09-11 MED ORDER — DIBUCAINE 1 % RE OINT: 1.0000 "application " | TOPICAL_OINTMENT | RECTAL | Status: DC | PRN

## 2016-09-11 MED ORDER — DIPHENHYDRAMINE HCL 25 MG PO CAPS: 25.0000 mg | ORAL_CAPSULE | Freq: Four times a day (QID) | ORAL | Status: DC | PRN

## 2016-09-11 MED ORDER — DEXTROSE 5 % IV SOLN
500.0000 mg | INTRAVENOUS | Status: DC
  Administered 2016-09-11 – 2016-09-13 (×3): 500 mg via INTRAVENOUS
  Filled 2016-09-11 (×3): qty 500

## 2016-09-11 NOTE — Consult Note (Signed)
Neonatology Note:   Attendance at Delivery:    I was asked by Dr. Macon LargeAnyanwu to attend this vaginal delivery of a ~[redacted] week EGA infant with poor PNC and mother + Influenza A. The mother is a 26 y.o. female G3P1011 @ 32.2wks , GBS unknown with prenatal care starting on 09/09/16. +tob and THC use.  ROM at delivery, fluid clear. Infant vigorous with good spontaneous cry and tone. Needed only minimal bulb suctioning. Ap 8/9. Lungs clear to ausc in DR.Marland Kitchen.  Mother updated via Flu risks in NICU and visitation limitations for herself and family.  She expressed understanding and agreement.  Transferred to NICU due to EGA.  Dineen Kidavid C. Leary RocaEhrmann, MD

## 2016-09-11 NOTE — Progress Notes (Signed)
Double electric breast pump set up with instructions.  Bottles and bullets given, along with soap and basin to wash parts

## 2016-09-11 NOTE — Lactation Note (Signed)
This note was copied from a baby's chart. Lactation Consultation Note  Patient Name: Jamie Tessa LernerDeja Duchemin ZOXWR'UToday's Date: 09/11/2016 Reason for consult: Initial assessment;NICU baby;Other (Comment) (Mom has flu and pneumonia.)  NICU baby 5414 hours old. Mom reports that she attempted to nurse first child, but only for a week. Mom states that she was pumping and getting more than the baby would take. But she states that she also supplemented with some formula. Mom reports that she wants to nurse this child. Mom has just finished pumping and has 1-2 ml at bedside. Enc mom that this is very good for first day and discussed progression of milk coming to volume. Enc mom to continue to pump every 2-3 hours for a total of at least 8 times/24 hours followed by hand expression. Mom asked if she could pump one breast at a time, so discussed the benefits of pumping both breasts simultaneously.   Mom given NICU booklet with review. Mom reports that she has private insurance. Enc mom to call to inquire about a personal pump. Discussed 2-week loaner pump and the benefit of hospital-grade pump while mom separated from baby.   Maternal Data Has patient been taught Hand Expression?: Yes Does the patient have breastfeeding experience prior to this delivery?: Yes  Feeding Feeding Type: Formula Nipple Type: Slow - flow Length of feed: 20 min  LATCH Score/Interventions                      Lactation Tools Discussed/Used Pump Review: Setup, frequency, and cleaning;Milk Storage Initiated by:: Bedside RN Date initiated:: 09/11/16   Consult Status Consult Status: Follow-up Date: 09/12/16 Follow-up type: In-patient    Sherlyn HayJennifer D Manish Ruggiero 09/11/2016, 6:14 PM

## 2016-09-12 LAB — PAIN MGMT, PROFILE 6 CONF W/O MM, U
6 Acetylmorphine: NEGATIVE ng/mL (ref <10)
AMPHETAMINES: NEGATIVE ng/mL (ref <500)
Alcohol Metabolites: NEGATIVE ng/mL (ref <500)
BARBITURATES: NEGATIVE ng/mL (ref <300)
BENZODIAZEPINES: NEGATIVE ng/mL (ref <100)
CREATININE: 131.9 mg/dL (ref >=20.0)
Cocaine Metabolite: NEGATIVE ng/mL (ref <150)
MARIJUANA METABOLITE: 50 ng/mL — AB (ref <5)
MARIJUANA METABOLITE: POSITIVE ng/mL — AB (ref <20)
METHADONE METABOLITE: NEGATIVE ng/mL (ref <100)
OPIATES: NEGATIVE ng/mL (ref <100)
OXYCODONE: NEGATIVE ng/mL (ref <100)
Oxidant: NEGATIVE ug/mL (ref <200)
PHENCYCLIDINE: NEGATIVE ng/mL (ref <25)
Please note:: 0
pH: 7.33 (ref 4.5–9.0)

## 2016-09-12 NOTE — Clinical Social Work Maternal (Signed)
CLINICAL SOCIAL WORK MATERNAL/CHILD NOTE  Patient Details  Name: Jamie Stevenson MRN: [id number] Date of Birth: 06/01/1991  Date:  09/12/2016  Clinical Social Worker Initiating Note:  Latia Mataya, MSW, LCSW-A   Date/ Time Initiated:  09/12/16/1608              Child's Name:  Jamie Stevenson    Legal Guardian:  Other (Comment) (Not established by court system; MOB and her husband (Jamie Stevenson DOB 08/13/91)  parent collectively in primary household composition )   Need for Interpreter:  None   Date of Referral:  09/11/16     Reason for Referral:  Other (Comment) (New NICU admission )   Referral Source:  RN   Address:  [address] [place], [place] [address]  Phone number:  [phone]   Household Members: Self, Spouse, Minor Children (Jamie Stevenson DOB 07/17/2009)   Natural Supports (not living in the home): Immediate Family, Friends, Parent   Professional Supports:Case Manager/Social Worker (Is seeing a LCSW at [facility] )   Employment:Full-time   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   Strengths: Home prepared for child , Ability to meet basic needs , Understanding of illness, Compliance with medical plan    Risk Factors/Current Problems: Mental Health Concerns , Family/Relationship Issues , Substance Use  (MOB has a hx of anxiety/depression; MOB's husband has hx of Schizophrenia with inpatient psych hospitilization in November of 2016 at [facility]; Neither are on meds for mental health dx. ; MOB has not informed her husband that he is not FOB)   Cognitive State: Alert , Able to Concentrate , Goal Oriented , Insightful    Mood/Affect: Calm , Comfortable ,  Interested    CSW Assessment:CSW met with MOB to complete assessment for consult regarding new NICU admission. At the time of this writer's arrival, MOB was laying in bed watching Tv. This writer explained her role and reasoning for visit being due to baby's NICU admission. MOB notes baby was born at 31 wks and at the time, she was (+) for influenza; thus baby is in NICU for care and monitoring of that. This writer assessed MOB's preparedness for baby's early arrival. MOB notes her husband and older son are out now getting the last few items they need to include a car seat. MOB notes having a crib and clothing and food. MOB notes she plans to do both breast feeding and bottle feeding. This writer informed MOB due to NICU admit criteria, they have collected a UDS sample and cord blood sample from baby. This writer informed MOB that babys UDS was (-) negative for substance; however, her cord blood test results are pending. At this time, MOB notes she used THC occasionally throughout pregnancy but denies abusing it or being addicted. MOB was unable to recall last use. This writer informed MOB of hospitals policy and procedure regarding substance and mandated reporting to CPS for positive results. MOB verbalized understanding. MOB denies any prior hx of CPS involvement, abuse/neglect and DV.   This writer assessed MOB for hx of mental health dx. MOB notes she has a hx of anxiety/depression and is seeing a counselor at clinic. MOB notes she is interested in therapy but cannot   therapy but cannot find anyone in network with her insurance. This Probation officer informed MOB to call the mental health/behavioral health coverage number on the back of her insurance card and they will be able to inform her of who is in network for full coverage. MOB verbalized understanding. MOB additionally informs this Probation officer that her husband has a mental health hx also to include dx of schizophrenia and inpatient psychiatric hospitalization in November of 2016. MOB notes to her knowledge, he is  not currently taking medication to treat his dx; however, also is managing well and has not had any outbreaks. This Probation officer stressed the importance of both her and her husband to seek medical attention in the event they begin to feel regression of there mental health symptoms. MOB verbalized understanding noting she will.   MOB further disclosed to this writer that her husband is not the FOB and she has not told him yet. MOB notes she has not decided how/when she will tell him but has decided to give baby his last name regardless. This Probation officer inquired, if FOB reacts negatively, what her plan is. MOB notes she is unsure and wants to seek therapy to work through it. This Probation officer praised MOB for acknowledging her current stressors and difficult decisions and ability to pursue therapy without hesitation. CSW informed MOB that she is available should she want to talk through it further during baby's NICU admission. MOB was thankful and verbalizes understanding. MOB did not disclose who FOB is and if she will inform him of bays arrival. This Probation officer discussed PPD and SIDS. MOB verbalized understanding and addressed no further concerns at this time. CSW will continue to follow pending CDS results and is available to offer needed support/resources for MOB and baby as warranted.   CSW Plan/Description:  Engineer, mining , Information/Referral to [employer] , Psychosocial Support and Ongoing Assessment of Needs, Other (Comment) (CSW will continue to follow pending CDS results. )    Ferdinand Lango Kamren Heskett, MSW, [facility]  Office: [phone]

## 2016-09-12 NOTE — Plan of Care (Signed)
Problem: Nutritional: Goal: Mothers verbalization of comfort with breastfeeding process will improve Outcome: Not Applicable Date Met: 57/89/78 Baby in NICU

## 2016-09-12 NOTE — Progress Notes (Signed)
Post Partum Day 1 Subjective: Patient reports feeling well. She denies chest pain, SOBL, lightheadedness/dizziness. She is ambulating without difficulty  Objective: Blood pressure (!) 90/50, pulse 71, temperature 97.2 F (36.2 C), temperature source Oral, resp. rate 17, height 5\' 1"  (1.549 m), weight 131 lb (59.4 kg), last menstrual period 01/28/2016, SpO2 98 %, unknown if currently breastfeeding.  Physical Exam:  General: alert, cooperative and no distress  LUNGs: clear to auscultation bilaterally with decreased breath sound in RLL Lochia: appropriate Uterine Fundus: firm DVT Evaluation: No evidence of DVT seen on physical exam.   Recent Labs  09/09/16 1600  HGB 10.6*  10.6*  HCT 31.3*  31.3*    Assessment/Plan: - Patient is doing well - Continue tamiflu for influenza - Continue Azithromycin and rocephin for CAP - Consider discharge tomorrow if she remains stable - Continue routine postpartum care   LOS: 2 days   Estie Sproule 09/12/2016, 7:50 AM

## 2016-09-13 ENCOUNTER — Inpatient Hospital Stay (HOSPITAL_COMMUNITY): Payer: No Typology Code available for payment source

## 2016-09-13 DIAGNOSIS — J1100 Influenza due to unidentified influenza virus with unspecified type of pneumonia: Secondary | ICD-10-CM | POA: Diagnosis present

## 2016-09-13 LAB — CULTURE, BETA STREP (GROUP B ONLY)

## 2016-09-13 LAB — HEMOGLOBINOPATHY EVALUATION
HEMATOCRIT: 31.3 % — AB (ref 35.0–45.0)
HEMOGLOBIN: 10.6 g/dL — AB (ref 11.7–15.5)
HGB A: 96.4 % (ref >96.0)
Hgb A2 Quant: 2.6 % (ref 1.8–3.5)
Hgb F Quant: <1 % (ref <2.0)
MCH: 31.8 pg (ref 27.0–33.0)
MCV: 94 fL (ref 80.0–100.0)
RBC: 3.33 MIL/uL — ABNORMAL LOW (ref 3.80–5.10)
RDW: 14.2 % (ref 11.0–15.0)

## 2016-09-13 LAB — CYTOLOGY - PAP: Diagnosis: NEGATIVE

## 2016-09-13 MED ORDER — AZITHROMYCIN 250 MG PO TABS: ORAL_TABLET | ORAL | 0 refills | Status: DC

## 2016-09-13 MED ORDER — IBUPROFEN 600 MG PO TABS: 600.0000 mg | ORAL_TABLET | Freq: Four times a day (QID) | ORAL | 0 refills | Status: AC

## 2016-09-13 MED ORDER — ACETAMINOPHEN 325 MG PO TABS: 650.0000 mg | ORAL_TABLET | ORAL | 0 refills | Status: AC | PRN

## 2016-09-13 MED ORDER — OSELTAMIVIR PHOSPHATE 75 MG PO CAPS: 75.0000 mg | ORAL_CAPSULE | Freq: Two times a day (BID) | ORAL | 0 refills | Status: AC

## 2016-09-13 MED ORDER — AMOXICILLIN-POT CLAVULANATE 875-125 MG PO TABS: 1.0000 | ORAL_TABLET | Freq: Two times a day (BID) | ORAL | 0 refills | Status: AC

## 2016-09-13 NOTE — Lactation Note (Signed)
This note was copied from a baby's chart. Lactation Consultation Note  Patient Name: [name] WUJWJ'XToday's Date: 09/13/2016 Reason for consult: Follow-up assessment   With this mom of a NICU baby, now 6757 hours old, and 34 weeks CGA. Mom is getting over the flu, and may be discharged to home today. She was not pumping due to uterine cramping. I reviewed with there the importance of pumping every 2-3 hours to protect her milk supply, and how the first 14 days after delivery can impact her supply more than any other time, due to breast receptors. I also encouraged mom to collect colostrum for her baby, which will help build antibodies for her baby, to protect her from the flu. Mom has evert, long  but slim nipples. The 24 flanges were pulling a lot of areola tissue. I had mom apply coconut oil, and had her try the 24 flanges , and mom said these were more comfortable. I cautioned mom that if they get too tight, to go back to the 24's.  Mom knows to call for questions/conerns.    Maternal Data    Feeding Feeding Type: Formula Nipple Type: Regular Length of feed: 15 min  LATCH Score/Interventions    Audible Swallowing:  (drops collected from left breas withh HE, noe from right)  Type of Nipple: Everted at rest and after stimulation  Comfort (Breast/Nipple): Soft / non-tender           Lactation Tools Discussed/Used Tools: Flanges Flange Size:  (decreased to 21 with better fit)   Consult Status Consult Status: PRN Date: 09/14/16 Follow-up type:  (NICU)    Jamie Stevenson, Jamie Stevenson 09/13/2016, 1:00 PM

## 2016-09-13 NOTE — Progress Notes (Addendum)
1530: assumed care of pt. Pt in the NICU visiting with baby.  1545: Pt back from NICU. Discharge instructions given with pt understanding. Lactation notified for pump.   1700: pt set up pump with lactation.   1720: informed pt she may go home at anytime and to check out at front desk.

## 2016-09-13 NOTE — Progress Notes (Signed)
CSW made referral to [place] CPS, Pam Miller.  CPS will follow-up with family after d/c.  CSW will continue to monitor infant's CDS and will report findings to CPS.  No barriers to d/c.  Quinteria Chisum Boyd-Gilyard, MSW, LCSW Clinical Social Work [phone]   

## 2016-09-13 NOTE — Lactation Note (Signed)
This note was copied from a baby's chart. Lactation Consultation Note  Mom and baby being discharged to home.  Two week pump rental done n mom instructed in pump use and part care. Mom knows to call for questions/conerns, and when her milk transfers in, she will call for an o/p consult. O/P lactation information given to mom.   Patient Name: Girl Tessa LernerDeja Crume WUJWJ'XToday's Date: 09/13/2016     Maternal Data    Feeding Feeding Type: Formula Nipple Type: Regular Length of feed: 30 min  LATCH Score/Interventions                      Lactation Tools Discussed/Used     Consult Status      Alfred LevinsLee, Bowden Boody Anne 09/13/2016, 5:11 PM

## 2016-09-13 NOTE — Discharge Instructions (Signed)
Community-Acquired Pneumonia, Adult Pneumonia is an infection of the lungs. There are different types of pneumonia. One type can develop while a person is in a hospital. A different type, called community-acquired pneumonia, develops in people who are not, or have not recently been, in the hospital or other health care facility. What are the causes? Pneumonia may be caused by bacteria, viruses, or funguses. Community-acquired pneumonia is often caused by Streptococcus pneumonia bacteria. These bacteria are often passed from one person to another by breathing in droplets from the cough or sneeze of an infected person. What increases the risk? The condition is more likely to develop in:  People who havechronic diseases, such as chronic obstructive pulmonary disease (COPD), asthma, congestive heart failure, cystic fibrosis, diabetes, or kidney disease.  People who haveearly-stage or late-stage HIV.  People who havesickle cell disease.  People who havehad their spleen removed (splenectomy).  People who havepoor Human resources officer.  People who havemedical conditions that increase the risk of breathing in (aspirating) secretions their own mouth and nose.  People who havea weakened immune system (immunocompromised).  People who smoke.  People whotravel to areas where pneumonia-causing germs commonly exist.  People whoare around animal habitats or animals that have pneumonia-causing germs, including birds, bats, rabbits, cats, and farm animals. What are the signs or symptoms? Symptoms of this condition include:  Adry cough.  A wet (productive) cough.  Fever.  Sweating.  Chest pain, especially when breathing deeply or coughing.  Rapid breathing or difficulty breathing.  Shortness of breath.  Shaking chills.  Fatigue.  Muscle aches. How is this diagnosed? Your health care provider will take a medical history and perform a physical exam. You may also have other tests,  including:  Imaging studies of your chest, including X-rays.  Tests to check your blood oxygen level and other blood gases.  Other tests on blood, mucus (sputum), fluid around your lungs (pleural fluid), and urine. If your pneumonia is severe, other tests may be done to identify the specific cause of your illness. How is this treated? The type of treatment that you receive depends on many factors, such as the cause of your pneumonia, the medicines you take, and other medical conditions that you have. For most adults, treatment and recovery from pneumonia may occur at home. In some cases, treatment must happen in a hospital. Treatment may include:  Antibiotic medicines, if the pneumonia was caused by bacteria.  Antiviral medicines, if the pneumonia was caused by a virus.  Medicines that are given by mouth or through an IV tube.  Oxygen.  Respiratory therapy. Although rare, treating severe pneumonia may include:  Mechanical ventilation. This is done if you are not breathing well on your own and you cannot maintain a safe blood oxygen level.  Thoracentesis. This procedureremoves fluid around one lung or both lungs to help you breathe better. Follow these instructions at home:  Take over-the-counter and prescription medicines only as told by your health care provider.  Only takecough medicine if you are losing sleep. Understand that cough medicine can prevent your bodys natural ability to remove mucus from your lungs.  If you were prescribed an antibiotic medicine, take it as told by your health care provider. Do not stop taking the antibiotic even if you start to feel better.  Sleep in a semi-upright position at night. Try sleeping in a reclining chair, or place a few pillows under your head.  Do not use tobacco products, including cigarettes, chewing tobacco, and e-cigarettes. If you  need help quitting, ask your health care provider.  Drink enough water to keep your urine clear  or pale yellow. This will help to thin out mucus secretions in your lungs. How is this prevented? There are ways that you can decrease your risk of developing community-acquired pneumonia. Consider getting a pneumococcal vaccine if:  You are older than 27 years of age.  You are older than 27 years of age and are undergoing cancer treatment, have chronic lung disease, or have other medical conditions that affect your immune system. Ask your health care provider if this applies to you. There are different types and schedules of pneumococcal vaccines. Ask your health care provider which vaccination option is best for you. You may also prevent community-acquired pneumonia if you take these actions:  Get an influenza vaccine every year. Ask your health care provider which type of influenza vaccine is best for you.  Go to the dentist on a regular basis.  Wash your hands often. Use hand sanitizer if soap and water are not available. Contact a health care provider if:  You have a fever.  You are losing sleep because you cannot control your cough with cough medicine. Get help right away if:  You have worsening shortness of breath.  You have increased chest pain.  Your sickness becomes worse, especially if you are an older adult or have a weakened immune system.  You cough up blood. This information is not intended to replace advice given to you by your health care provider. Make sure you discuss any questions you have with your health care provider. Document Released: 08/02/2005 Document Revised: 12/11/2015 Document Reviewed: 11/27/2014 Elsevier Interactive Patient Education  2017 Elsevier Inc.  Vaginal Delivery, Care After Refer to this sheet in the next few weeks. These instructions provide you with information about caring for yourself after vaginal delivery. Your health care provider may also give you more specific instructions. Your treatment has been planned according to current  medical practices, but problems sometimes occur. Call your health care provider if you have any problems or questions. What can I expect after the procedure? After vaginal delivery, it is common to have:  Some bleeding from your vagina.  Soreness in your abdomen, your vagina, and the area of skin between your vaginal opening and your anus (perineum).  Pelvic cramps.  Fatigue. Follow these instructions at home: Medicines  Take over-the-counter and prescription medicines only as told by your health care provider.  If you were prescribed an antibiotic medicine, take it as told by your health care provider. Do not stop taking the antibiotic until it is finished. Driving  Do not drive or operate heavy machinery while taking prescription pain medicine.  Do not drive for 24 hours if you received a sedative. Lifestyle  Do not drink alcohol. This is especially important if you are breastfeeding or taking medicine to relieve pain.  Do not use tobacco products, including cigarettes, chewing tobacco, or e-cigarettes. If you need help quitting, ask your health care provider. Eating and drinking  Drink at least 8 eight-ounce glasses of water every day unless you are told not to by your health care provider. If you choose to breastfeed your baby, you may need to drink more water than this.  Eat high-fiber foods every day. These foods may help prevent or relieve constipation. High-fiber foods include:  Whole grain cereals and breads.  Brown rice.  Beans.  Fresh fruits and vegetables. Activity  Return to your normal activities as told  by your health care provider. Ask your health care provider what activities are safe for you.  Rest as much as possible. Try to rest or take a nap when your baby is sleeping.  Do not lift anything that is heavier than your baby or 10 lb (4.5 kg) until your health care provider says that it is safe.  Talk with your health care provider about when you can  engage in sexual activity. This may depend on your:  Risk of infection.  Rate of healing.  Comfort and desire to engage in sexual activity. Vaginal Care  If you have an episiotomy or a vaginal tear, check the area every day for signs of infection. Check for:  More redness, swelling, or pain.  More fluid or blood.  Warmth.  Pus or a bad smell.  Do not use tampons or douches until your health care provider says this is safe.  Watch for any blood clots that may pass from your vagina. These may look like clumps of dark red, brown, or black discharge. General instructions  Keep your perineum clean and dry as told by your health care provider.  Wear loose, comfortable clothing.  Wipe from front to back when you use the toilet.  Ask your health care provider if you can shower or take a bath. If you had an episiotomy or a perineal tear during labor and delivery, your health care provider may tell you not to take baths for a certain length of time.  Wear a bra that supports your breasts and fits you well.  If possible, have someone help you with household activities and help care for your baby for at least a few days after you leave the hospital.  Keep all follow-up visits for you and your baby as told by your health care provider. This is important. Contact a health care provider if:  You have:  Vaginal discharge that has a bad smell.  Difficulty urinating.  Pain when urinating.  A sudden increase or decrease in the frequency of your bowel movements.  More redness, swelling, or pain around your episiotomy or vaginal tear.  More fluid or blood coming from your episiotomy or vaginal tear.  Pus or a bad smell coming from your episiotomy or vaginal tear.  A fever.  A rash.  Little or no interest in activities you used to enjoy.  Questions about caring for yourself or your baby.  Your episiotomy or vaginal tear feels warm to the touch.  Your episiotomy or vaginal  tear is separating or does not appear to be healing.  Your breasts are painful, hard, or turn red.  You feel unusually sad or worried.  You feel nauseous or you vomit.  You pass large blood clots from your vagina. If you pass a blood clot from your vagina, save it to show to your health care provider. Do not flush blood clots down the toilet without having your health care provider look at them.  You urinate more than usual.  You are dizzy or light-headed.  You have not breastfed at all and you have not had a menstrual period for 12 weeks after delivery.  You have stopped breastfeeding and you have not had a menstrual period for 12 weeks after you stopped breastfeeding. Get help right away if:  You have:  Pain that does not go away or does not get better with medicine.  Chest pain.  Difficulty breathing.  Blurred vision or spots in your vision.  Thoughts about  hurting yourself or your baby.  You develop pain in your abdomen or in one of your legs.  You develop a severe headache.  You faint.  You bleed from your vagina so much that you fill two sanitary pads in one hour. This information is not intended to replace advice given to you by your health care provider. Make sure you discuss any questions you have with your health care provider. Document Released: 07/30/2000 Document Revised: 01/14/2016 Document Reviewed: 08/17/2015 Elsevier Interactive Patient Education  2017 [employer].

## 2016-09-14 ENCOUNTER — Other Ambulatory Visit: Payer: No Typology Code available for payment source

## 2016-09-14 LAB — GC/CHLAMYDIA PROBE AMP (~~LOC~~) NOT AT ARMC
CHLAMYDIA, DNA PROBE: NEGATIVE
NEISSERIA GONORRHEA: NEGATIVE

## 2016-09-15 NOTE — Discharge Summary (Signed)
OB Discharge Summary     Patient Name: Jamie Stevenson DOB: 1990-09-06 MRN: [id number]  Date of admission: 09/10/2016 Delivering MD: Cam HaiSHAW, KIMBERLY D   Date of discharge: 09/15/2016  Admitting diagnosis: 30wks stomach pains Intrauterine pregnancy: 1023w5d     Secondary diagnosis:  Principal Problem:   Influenza A and associated pneumonia Active Problems:   Preterm labor in third trimester  Additional problems: None     Discharge diagnosis: Preterm Pregnancy Delivered and Influenza A and community-acquired pneumonia                                                                                                Post partum procedures:IV antibiotics for community-acquired pneumonia  Augmentation: None  Complications: None  Hospital course:  Onset of Labor With Vaginal Delivery     26 y.o. yo [id number] at 4423w5d was admitted in Active Labor on 09/10/2016. Patient had an uncomplicated labor course as follows:  Membrane Rupture Time/Date: 3:21 AM ,09/11/2016   Intrapartum Procedures: Episiotomy: None [1]                                         Lacerations:  None [1]  Patient had a delivery of a Viable infant.  09/11/2016  Information for the patient's newborn:  Jamie Stevenson [[id number]][[id number]]  Delivery Method: Vaginal, Spontaneous Delivery (Filed from Delivery Summary)   1 patient presented she did have a temperature to 100 and associated loose bowel movements and myalgias. Postpartum she was found to have influenza as well as community-acquired pneumonia. She was treated with Tamiflu as well as Rocephin and azithromycin IV for community-acquired pneumonia. She was transitioned to oral agents completing a 5 day course of Tamiflu, a 5 day course of azithromycin, and a 10 day course of antibiotics which started as Rocephin and patient was discharged home on by mouth Augmentin. Patient is discharged home in stable condition on 09/15/16.   Physical exam  Vitals:   09/12/16 1951 09/13/16 0606  09/13/16 0803 09/13/16 1621  BP: (!) 99/58 98/61 (!) 103/57 117/65  Pulse: 71 82 (!) 56   Resp: 16 18 16 18   Temp: 98 F (36.7 C) 98.2 F (36.8 C) 98.4 F (36.9 C) 98 F (36.7 C)  TempSrc: Oral Oral Oral Oral  SpO2: 100% 100% 99%   Weight:      Height:       General: alert, cooperative and no distress Lochia: appropriate Uterine Fundus: firm Incision: N/A DVT Evaluation: No evidence of DVT seen on physical exam. Labs: Lab Results  Component Value Date   WBC 9.9 09/09/2016   HGB 10.6 (L) 09/09/2016   HGB 10.6 (L) 09/09/2016   HCT 31.3 (L) 09/09/2016   HCT 31.3 (L) 09/09/2016   MCV 94.0 09/09/2016   MCV 94.0 09/09/2016   PLT 164 09/09/2016   No flowsheet data found.  Discharge instruction: per After Visit Summary and "Baby and Me Booklet".  After visit meds:  Allergies as of 09/13/2016   No Known  Allergies     Medication List    TAKE these medications   acetaminophen 325 MG tablet Commonly known as:  TYLENOL Take 2 tablets (650 mg total) by mouth every 4 (four) hours as needed (for pain scale < 4).   amoxicillin-clavulanate 875-125 MG tablet Commonly known as:  AUGMENTIN Take 1 tablet by mouth 2 (two) times daily.   azithromycin 250 MG tablet Commonly known as:  ZITHROMAX take 1 tab daily   diphenhydramine-acetaminophen 25-500 MG Tabs tablet Commonly known as:  TYLENOL PM Take 1 tablet by mouth as needed.   ibuprofen 600 MG tablet Commonly known as:  ADVIL,MOTRIN Take 1 tablet (600 mg total) by mouth every 6 (six) hours.   oseltamivir 75 MG capsule Commonly known as:  TAMIFLU Take 1 capsule (75 mg total) by mouth 2 (two) times daily.   Prenatal Vitamins 0.8 MG tablet Take 1 tablet by mouth daily.       Diet: routine diet  Activity: Advance as tolerated. Pelvic rest for 6 weeks.   Outpatient follow up:6 weeks Follow up Appt:No future appointments. Follow up Visit:No Follow-up on file.  Postpartum contraception: Progesterone only  pills  Newborn Data: Live born female  Birth Weight: 5 lb 1.1 oz (2300 g) APGAR: 8, 9  Baby Feeding: Breast Disposition:NICU   09/15/2016 Ernestina Penna, MD

## 2016-09-16 ENCOUNTER — Ambulatory Visit (HOSPITAL_COMMUNITY): Payer: No Typology Code available for payment source

## 2016-09-21 ENCOUNTER — Encounter: Payer: Self-pay | Admitting: Medical

## 2016-09-22 ENCOUNTER — Inpatient Hospital Stay (HOSPITAL_COMMUNITY)
Admission: EM | Admit: 2016-09-22 | Discharge: 2016-09-24 | DRG: 776 | Disposition: A | Discharge status: Home / Self Care | Payer: No Typology Code available for payment source | Attending: Obstetrics & Gynecology | Admitting: Obstetrics & Gynecology

## 2016-09-22 ENCOUNTER — Encounter (HOSPITAL_COMMUNITY): Payer: Self-pay | Admitting: Nurse Practitioner

## 2016-09-22 DIAGNOSIS — O159 Eclampsia, unspecified as to time period: Secondary | ICD-10-CM

## 2016-09-22 DIAGNOSIS — O99335 Smoking (tobacco) complicating the puerperium: Secondary | ICD-10-CM | POA: Diagnosis present

## 2016-09-22 DIAGNOSIS — F17210 Nicotine dependence, cigarettes, uncomplicated: Secondary | ICD-10-CM | POA: Diagnosis present

## 2016-09-22 DIAGNOSIS — O152 Eclampsia in the puerperium: Principal | ICD-10-CM | POA: Diagnosis present

## 2016-09-22 LAB — COMPREHENSIVE METABOLIC PANEL
ALBUMIN: 2.9 g/dL — AB (ref 3.5–5.0)
ALK PHOS: 77 U/L (ref 38–126)
ALT: 14 U/L (ref 14–54)
AST: 22 U/L (ref 15–41)
Anion gap: 7 (ref 5–15)
BILIRUBIN TOTAL: 0.8 mg/dL (ref 0.3–1.2)
BUN: 6 mg/dL (ref 6–20)
CALCIUM: 8.5 mg/dL — AB (ref 8.9–10.3)
CO2: 25 mmol/L (ref 22–32)
Chloride: 109 mmol/L (ref 101–111)
Creatinine, Ser: 0.9 mg/dL (ref 0.44–1.00)
GFR calc Af Amer: >60 mL/min (ref >60)
GFR calc non Af Amer: >60 mL/min (ref >60)
GLUCOSE: 66 mg/dL (ref 65–99)
Potassium: 3.4 mmol/L — ABNORMAL LOW (ref 3.5–5.1)
Sodium: 141 mmol/L (ref 135–145)
TOTAL PROTEIN: 6.8 g/dL (ref 6.5–8.1)

## 2016-09-22 LAB — CBC WITH DIFFERENTIAL/PLATELET
BASOS ABS: 0 10*3/uL (ref 0.0–0.1)
BASOS PCT: 0 %
EOS PCT: 1 %
Eosinophils Absolute: 0 10*3/uL (ref 0.0–0.7)
HCT: 34 % — ABNORMAL LOW (ref 36.0–46.0)
Hemoglobin: 11.4 g/dL — ABNORMAL LOW (ref 12.0–15.0)
LYMPHS PCT: 34 %
Lymphs Abs: 1.9 10*3/uL (ref 0.7–4.0)
MCH: 30.3 pg (ref 26.0–34.0)
MCHC: 33.5 g/dL (ref 30.0–36.0)
MCV: 90.4 fL (ref 78.0–100.0)
MONO ABS: 0.4 10*3/uL (ref 0.1–1.0)
MONOS PCT: 7 %
Neutro Abs: 3.2 10*3/uL (ref 1.7–7.7)
Neutrophils Relative %: 58 %
PLATELETS: 304 10*3/uL (ref 150–400)
RBC: 3.76 MIL/uL — ABNORMAL LOW (ref 3.87–5.11)
RDW: 13.5 % (ref 11.5–15.5)
WBC: 5.6 10*3/uL (ref 4.0–10.5)

## 2016-09-22 LAB — TYPE AND SCREEN
ABO/RH(D): O POS
ABO/RH(D): O POS
ANTIBODY SCREEN: NEGATIVE
Antibody Screen: NEGATIVE

## 2016-09-22 LAB — ABO/RH: ABO/RH(D): O POS

## 2016-09-22 MED ORDER — MAGNESIUM SULFATE 50 % IJ SOLN: 4.0000 g | Freq: Once | INTRAMUSCULAR | Status: DC

## 2016-09-22 MED ORDER — ACETAMINOPHEN 325 MG PO TABS
650.0000 mg | ORAL_TABLET | ORAL | Status: DC | PRN
  Administered 2016-09-24 (×2): 650 mg via ORAL
  Filled 2016-09-22 (×2): qty 2

## 2016-09-22 MED ORDER — PRENATAL MULTIVITAMIN CH
1.0000 | ORAL_TABLET | Freq: Every day | ORAL | Status: DC
  Administered 2016-09-23 – 2016-09-24 (×2): 1 via ORAL
  Filled 2016-09-22 (×2): qty 1

## 2016-09-22 MED ORDER — DOCUSATE SODIUM 100 MG PO CAPS: 100.0000 mg | ORAL_CAPSULE | Freq: Every day | ORAL | Status: DC

## 2016-09-22 MED ORDER — MAGNESIUM SULFATE 50 % IJ SOLN: 6.0000 g | Freq: Once | INTRAMUSCULAR | Status: DC

## 2016-09-22 MED ORDER — LACTATED RINGERS IV SOLN
INTRAVENOUS | Status: DC
  Administered 2016-09-22 – 2016-09-23 (×2): via INTRAVENOUS

## 2016-09-22 MED ORDER — ACETAMINOPHEN 325 MG PO TABS
650.0000 mg | ORAL_TABLET | Freq: Once | ORAL | Status: AC
  Administered 2016-09-22: 650 mg via ORAL
  Filled 2016-09-22: qty 2

## 2016-09-22 MED ORDER — ZOLPIDEM TARTRATE 5 MG PO TABS: 5.0000 mg | ORAL_TABLET | Freq: Every evening | ORAL | Status: DC | PRN

## 2016-09-22 MED ORDER — MAGNESIUM SULFATE 50 % IJ SOLN
2.0000 g/h | INTRAMUSCULAR | Status: DC
  Administered 2016-09-22 (×3): 2 g/h via INTRAVENOUS
  Filled 2016-09-22 (×2): qty 80

## 2016-09-22 MED ORDER — MAGNESIUM SULFATE BOLUS VIA INFUSION
6.0000 g | Freq: Once | INTRAVENOUS | Status: AC
  Administered 2016-09-22: 6 g via INTRAVENOUS
  Filled 2016-09-22: qty 500

## 2016-09-22 NOTE — ED Notes (Signed)
Bed: JX91WA05 Expected date:  Expected time:  Means of arrival:  Comments: 26 yr old seizure

## 2016-09-22 NOTE — Consult Note (Signed)
Initial consult with this mom who was readmitted for seizures, 11 days post partum. Mom was breast feeding, so is pumping with DEP, and sendign her milk home to her baby. Mom is not able to use her left arm, due to IV position, so I made her a hands free bra out of the hospital sstretch panties. Mom denies any questions at this time, and I advised her to pump until she stops dripping to protect her supply.

## 2016-09-22 NOTE — ED Triage Notes (Signed)
Pt brought in by EMS. Per EMS patient is s/p Postpartum 1 week. [facility]. She denied any complications during pregnancy. Per EMS pt stated everything was normal. States the husband reported that she woke up yesterday complaining of a sore tongue and had bit her tongue in her sleep. She was not aware what happened and neither was he. This morning patient began having seizure in her sleep and husband called 911. EMS reports when they arrived she was postictal. She was slightly lethargic, but A&Ox4. Patient denies history of seizures or family history. VSS 150/100, 80 hr, 18 resp, 97%RA, CBG 92. #20 in LAC. Patient denies pain.

## 2016-09-22 NOTE — ED Provider Notes (Signed)
[facility]-EMERGENCY DEPT Provider Note   CSN: [id number] Arrival date & time: 09/22/16  [id number]  By signing my name below, I, Elder NegusRussell Johnston, attest that this documentation has been prepared under the direction and in the presence of Zadie Rhineonald Samadhi Mahurin, MD. Electronically Signed: Elder Negusussell Johnston, Scribe. 09/22/16. 3:58 AM.   History   Chief Complaint Chief Complaint  Patient presents with  . Seizures    HPI Jamie Stevenson is a 26 y.o. female who is over 1 week postpartum following uncomplicated pregnancy presents to the ED following a witnessed seizure. According to the husband, this patient was going about daily activity at home several hours ago when she fell to the floor and started convulsing. This lasted for approximately 1 minute and resolved spontaneously. She was confused thereafter. Associated with tongue biting. At interview, the patient endorses loss of consciousness. She denies any history of seizures. The husband denies any injuries. She otherwise reports 2 weeks of intermittent headaches; she was also recently diagnosed with pneumonia during her admission for delivery. Denies any traumatic injury from seizure  The history is provided by the patient. No language interpreter was used.  Seizures   This is a new problem. The current episode started 1 to 2 hours ago. The problem has been resolved. There was 1 seizure. The most recent episode lasted 30 to 120 seconds. Pertinent negatives include no headaches. Characteristics include bit tongue. The episode was witnessed. The seizures did not continue in the ED.    Past Medical History:  Diagnosis Date  . Depression     Patient Active Problem List   Diagnosis Date Noted  . Influenza A and associated pneumonia 09/13/2016  . Preterm labor in third trimester 09/10/2016  . Supervision of low-risk pregnancy 09/09/2016  . Depression, major, single episode 09/09/2016  . Supervision of normal pregnancy 09/09/2016    Past Surgical History:    Procedure Laterality Date  . NO PAST SURGERIES      OB History    Gravida Para Term Preterm AB Living   3 2 1 1 1 2    SAB TAB Ectopic Multiple Live Births         0 2       Home Medications    Prior to Admission medications   Medication Sig Start Date End Date Taking? Authorizing Provider  acetaminophen (TYLENOL) 325 MG tablet Take 2 tablets (650 mg total) by mouth every 4 (four) hours as needed (for pain scale < 4). 09/13/16   Lorne SkeensNicholas Michael Schenk, MD  azithromycin [facility](ZITHROMAX) 250 MG tablet take 1 tab daily 09/13/16   Lorne SkeensNicholas Michael Schenk, MD  diphenhydramine-acetaminophen (TYLENOL PM) 25-500 MG TABS tablet Take 1 tablet by mouth as needed.    Historical Provider, MD  ibuprofen (ADVIL,MOTRIN) 600 MG tablet Take 1 tablet (600 mg total) by mouth every 6 (six) hours. 09/13/16   Lorne SkeensNicholas Michael Schenk, MD  Prenatal Multivit-Min-Fe-FA (PRENATAL VITAMINS) 0.8 MG tablet Take 1 tablet by mouth daily. 08/18/16   Aviva SignsMarie L Williams, CNM    Family History Family History  Problem Relation Age of Onset  . Diabetes Mother     Social History Social History  Substance Use Topics  . Smoking status: Current Some Day Smoker    Packs/day: 0.25    Types: Cigarettes  . Smokeless tobacco: Never Used  . Alcohol use No     Allergies   Patient has no known allergies.   Review of Systems Review of Systems  Neurological: Positive for seizures. Negative for headaches.  Tongue biting.   All other systems reviewed and are negative.    Physical Exam Updated Vital Signs BP (!) 148/103 (BP Location: Right Arm)   Pulse 73   Resp 13   Ht 5\' 1"  (1.549 m)   Wt 131 lb (59.4 kg)   LMP 01/28/2016   SpO2 96%   BMI 24.75 kg/m   Physical Exam CONSTITUTIONAL: Well developed/well nourished HEAD: Normocephalic/atraumatic, no signs of trauma EYES: EOMI/PERRL ENMT: Mucous membranes moist, small tongue laceration noted NECK: supple no meningeal signs SPINE/BACK:entire spine  nontender CV: S1/S2 noted, no murmurs/rubs/gallops noted LUNGS: Lungs are clear to auscultation bilaterally, no apparent distress ABDOMEN: soft, nontender, no rebound or guarding, bowel sounds noted throughout abdomen GU:no cva tenderness NEURO: Pt is awake/alert/appropriate, moves all extremitiesx4.  No facial droop.  No arm or leg drift. No clonus. Mild bilateral patellar hyperreflexia noted.  EXTREMITIES: pulses normal/equal, full ROM, no signs of trauma to extremities SKIN: warm, color normal PSYCH: no abnormalities of mood noted, alert and oriented to situation   ED Treatments / Results  DIAGNOSTIC STUDIES: Oxygen Saturation is 96 percent on room air which is normal by my interpretation.    COORDINATION OF CARE: 3:54 AM Discussed treatment plan with pt at bedside and pt agreed to plan.  Labs (all labs ordered are listed, but only abnormal results are displayed) Labs Reviewed  CBC WITH DIFFERENTIAL/PLATELET - Abnormal; Notable for the following:       Result Value   RBC 3.76 (*)    Hemoglobin 11.4 (*)    HCT 34.0 (*)    All other components within normal limits  COMPREHENSIVE METABOLIC PANEL  TYPE AND SCREEN    EKG  EKG Interpretation None       Radiology No results found.  Procedures Procedures  CRITICAL CARE Performed by: Joya Gaskins Total critical care time: 33 minutes Critical care time was exclusive of separately billable procedures and treating other patients. Critical care was necessary to treat or prevent imminent or life-threatening deterioration. Critical care was time spent personally by me on the following activities: development of treatment plan with patient and/or surrogate as well as nursing, discussions with consultants, evaluation of patient's response to treatment, examination of patient, obtaining history from patient or surrogate, ordering and performing treatments and interventions, ordering and review of laboratory studies, ordering and  review of radiographic studies, pulse oximetry and re-evaluation of patient's condition. PATIENT WITH ECLAMPSIA REQUIRING MAGNESIUM AND TRANSFER TO [facility] ADMISSION   Medications Ordered in ED Medications  magnesium bolus via infusion 6 g (not administered)  magnesium sulfate 40 g in lactated ringers 500 mL (0.08 g/mL) OB infusion (not administered)  acetaminophen (TYLENOL) tablet 650 mg (not administered)  zolpidem (AMBIEN) tablet 5 mg (not administered)  docusate sodium (COLACE) capsule 100 mg (not administered)  prenatal multivitamin tablet 1 tablet (not administered)  lactated ringers infusion (not administered)     Initial Impression / Assessment and Plan / ED Course  I have reviewed the triage vital signs and the nursing notes.  Pertinent labs  results that were available during my care of the patient were reviewed by me and considered in my medical decision making (see chart for details).     4:21 AM PT IN THE ED WITH NEW ONSET SEIZURE ABOUT 10 DAYS POSTPARTUM SHE IS CURRENTLY HYPERTENSIVE SHE WAS MILDLY CONFUSED POSTICTAL BUT NOW IMPROVING CONCERN FOR ECLAMPSIA D/W DR Adrian Blackwater WITH OBGYN ADMIT TO [facility] START MAGNESIUM 6GM BOLUS AND 2GM/HR DRIP PATIENT/FAMILY  UPDATED ON PLAN   Final Clinical Impressions(s) / ED Diagnoses   Final diagnoses:  Eclampsia    New Prescriptions New Prescriptions   No medications on file  I personally performed the services described in this documentation, which was scribed in my presence. The recorded information has been reviewed and is accurate.       Zadie Rhine, MD 09/22/16 [phone]

## 2016-09-22 NOTE — H&P (Signed)
OB Faculty Practice History and Physical  Jamie Stevenson ZOX:[id number] DOB: [birth year] DOA: 09/22/2016  Referring physician: Dr Bebe ShaggyWickline, ED physician Prenatal care Office: [facility]  Outpatient Specialists: none  Patient Coming From: home  Chief Complaint: Convulsions at home  HPI: Jamie LernerDeja Winger is a 26 y.o. female [id number] who is 11 days postpartum from a viable, preterm, spontaneous vaginal delivery at 6624w5d. Postpartum period complicated by confirmed influenza, but patient returned home two days postpartum without any other complications. Patient presented to ED via EMS after husband witnessed generalized convulsions at home. Seizures were self-limiting, patient post-ictal at time of arrival in ED. Patient possibly had seizure the night before, as she woke up with a sore tongue, having bitten it in her sleep. No palliating or provoking factors.  Emergency Department Course: CBC, CMP collected. Magnesium 6g load started in ED with 2g per hour continuously per my instructions.  Review of Systems:   Pt complains of HA and scotoma yesterday. Improved now.  Pt denies any fevers, chills, nausea, vomiting, diarrhea, constipation, abdominal pain, shortness of breath, dyspnea on exertion, orthopnea, cough, wheezing, palpitations, headache, vision changes, lightheadedness, dizziness, melena, rectal bleeding.  Review of systems are otherwise negative  Past Medical History:  Diagnosis Date  . Depression    Past Surgical History:  Procedure Laterality Date  . NO PAST SURGERIES     Social History:  reports that she has been smoking Cigarettes.  She has been smoking about 0.25 packs per day. She has never used smokeless tobacco. She reports that she uses drugs, including Marijuana. She reports that she does not drink alcohol. Patient lives at home  No Known Allergies  Family History  Problem Relation Age of Onset  . Diabetes Mother       Prior to Admission  medications   Medication Sig Start Date End Date Taking? Authorizing Provider  diphenhydramine-acetaminophen (TYLENOL PM) 25-500 MG TABS tablet Take 1 tablet by mouth as needed (pain/sleep).    Yes Historical Provider, MD  ibuprofen (ADVIL,MOTRIN) 600 MG tablet Take 1 tablet (600 mg total) by mouth every 6 (six) hours. 09/13/16  Yes Lorne SkeensNicholas Michael Schenk, MD  Prenatal Multivit-Min-Fe-FA (PRENATAL VITAMINS) 0.8 MG tablet Take 1 tablet by mouth daily. 08/18/16  Yes Aviva SignsMarie L Williams, CNM  acetaminophen (TYLENOL) 325 MG tablet Take 2 tablets (650 mg total) by mouth every 4 (four) hours as needed (for pain scale < 4). 09/13/16   Lorne SkeensNicholas Michael Schenk, MD    Physical Exam: BP (!) 148/103 (BP Location: Right Arm)   Pulse 73   Resp 13   Ht 5\' 1"  (1.549 m)   Wt 131 lb (59.4 kg)   LMP 01/28/2016   SpO2 96%   BMI 24.75 kg/m   General: Young black female. Awake and alert and oriented x3. No acute cardiopulmonary distress.  HEENT: Normocephalic atraumatic.  Right and left ears normal in appearance.  Pupils equal, round, reactive to light. Extraocular muscles are intact. Sclerae anicteric and noninjected.  Moist mucosal membranes. No mucosal lesions.  Neck: Neck supple without lymphadenopathy. No carotid bruits. No masses palpated.  Cardiovascular: Regular rate with normal S1-S2 sounds. No murmurs, rubs, gallops auscultated. No JVD.  Respiratory: Good respiratory effort with no wheezes, rales, rhonchi. Lungs clear to auscultation bilaterally.  No accessory muscle use. Abdomen: Soft, nontender, nondistended. Active bowel sounds. No masses or hepatosplenomegaly  Skin: No rashes, lesions, or ulcerations.  Dry, warm to touch. 2+ dorsalis pedis and radial pulses. Musculoskeletal: No  calf or leg pain. All major joints not erythematous nontender.  No upper or lower joint deformation.  Good ROM.  No contractures  Psychiatric: Intact judgment and insight. Pleasant and cooperative. Neurologic: No focal  neurological deficits. Strength is 5/5 and symmetric in upper and lower extremities.  Cranial nerves II through XII are grossly intact.           Labs on Admission: I have personally reviewed following labs and imaging studies  CBC:  Recent Labs Lab 09/22/16 0357  WBC 5.6  NEUTROABS 3.2  HGB 11.4*  HCT 34.0*  MCV 90.4  PLT 304   Basic Metabolic Panel:  Recent Labs Lab 09/22/16 0357  NA 141  K 3.4*  CL 109  CO2 25  GLUCOSE 66  BUN 6  CREATININE 0.90  CALCIUM 8.5*   GFR: CrCl cannot be calculated (No order found.). Liver Function Tests:  Recent Labs Lab 09/22/16 0357  AST 22  ALT 14  ALKPHOS 77  BILITOT 0.8  PROT 6.8  ALBUMIN 2.9*   No results for input(s): LIPASE, AMYLASE in the last 168 hours. No results for input(s): AMMONIA in the last 168 hours. Coagulation Profile: No results for input(s): INR, PROTIME in the last 168 hours. Cardiac Enzymes: No results for input(s): CKTOTAL, CKMB, CKMBINDEX, TROPONINI in the last 168 hours. BNP (last 3 results) No results for input(s): PROBNP in the last 8760 hours. HbA1C: No results for input(s): HGBA1C in the last 72 hours. CBG: No results for input(s): GLUCAP in the last 168 hours. Lipid Profile: No results for input(s): CHOL, HDL, LDLCALC, TRIG, CHOLHDL, LDLDIRECT in the last 72 hours. Thyroid Function Tests: No results for input(s): TSH, T4TOTAL, FREET4, T3FREE, THYROIDAB in the last 72 hours. Anemia Panel: No results for input(s): VITAMINB12, FOLATE, FERRITIN, TIBC, IRON, RETICCTPCT in the last 72 hours. Urine analysis:    Component Value Date/Time   COLORURINE YELLOW 09/10/2016 2100   APPEARANCEUR CLOUDY (A) 09/10/2016 2100   LABSPEC 1.008 09/10/2016 2100   PHURINE 6.0 09/10/2016 2100   GLUCOSEU NEGATIVE 09/10/2016 2100   HGBUR LARGE (A) 09/10/2016 2100   BILIRUBINUR NEGATIVE 09/10/2016 2100   KETONESUR 20 (A) 09/10/2016 2100   PROTEINUR 100 (A) 09/10/2016 2100   UROBILINOGEN 2.0 (H) 09/09/2016  1450   NITRITE NEGATIVE 09/10/2016 2100   LEUKOCYTESUR MODERATE (A) 09/10/2016 2100   Sepsis Labs: @LABRCNTIP (procalcitonin:4,lacticidven:4) )No results found for this or any previous visit (from the past 240 hour(s)).   Radiological Exams on Admission: No results found.  Assessment/Plan: Active Problems:   Eclampsia    This patient was discussed with the ED physician, including pertinent vitals, physical exam findings, labs, and imaging.  We also discussed care given by the ED provider.  #1 Eclampsia   CMP, CBC stable  BP not in severe range currently.  Continue magnesium for 24 hours, the observe of magnesium for BP  Repeat CBC, CMP tomorrow morning  Routine postpartum care otherwise.   Levie Heritage, DO Faculty Practice Attending [facility]  Please call [phone] 24/7 for any OB related concerns on this patient.

## 2016-09-23 DIAGNOSIS — O152 Eclampsia in the puerperium: Principal | ICD-10-CM

## 2016-09-23 LAB — COMPREHENSIVE METABOLIC PANEL
ALT: 10 U/L — AB (ref 14–54)
AST: 15 U/L (ref 15–41)
Albumin: 2.3 g/dL — ABNORMAL LOW (ref 3.5–5.0)
Alkaline Phosphatase: 73 U/L (ref 38–126)
Anion gap: 7 (ref 5–15)
BUN: 6 mg/dL (ref 6–20)
CO2: 25 mmol/L (ref 22–32)
Calcium: 5.9 mg/dL — CL (ref 8.9–10.3)
Chloride: 105 mmol/L (ref 101–111)
Creatinine, Ser: 0.74 mg/dL (ref 0.44–1.00)
GFR calc Af Amer: >60 mL/min (ref >60)
GFR calc non Af Amer: >60 mL/min (ref >60)
Glucose, Bld: 93 mg/dL (ref 65–99)
POTASSIUM: 3 mmol/L — AB (ref 3.5–5.1)
SODIUM: 137 mmol/L (ref 135–145)
Total Bilirubin: 0.4 mg/dL (ref 0.3–1.2)
Total Protein: 5.5 g/dL — ABNORMAL LOW (ref 6.5–8.1)

## 2016-09-23 LAB — CBC
HCT: 32.8 % — ABNORMAL LOW (ref 36.0–46.0)
Hemoglobin: 11.4 g/dL — ABNORMAL LOW (ref 12.0–15.0)
MCH: 31.2 pg (ref 26.0–34.0)
MCHC: 34.8 g/dL (ref 30.0–36.0)
MCV: 89.9 fL (ref 78.0–100.0)
PLATELETS: 284 10*3/uL (ref 150–400)
RBC: 3.65 MIL/uL — AB (ref 3.87–5.11)
RDW: 13.9 % (ref 11.5–15.5)
WBC: 6 10*3/uL (ref 4.0–10.5)

## 2016-09-23 LAB — MAGNESIUM: Magnesium: 6.7 mg/dL (ref 1.7–2.4)

## 2016-09-23 MED ORDER — POTASSIUM CHLORIDE CRYS ER 20 MEQ PO TBCR
40.0000 meq | EXTENDED_RELEASE_TABLET | Freq: Two times a day (BID) | ORAL | Status: DC
  Administered 2016-09-23 – 2016-09-24 (×3): 40 meq via ORAL
  Filled 2016-09-23 (×5): qty 2

## 2016-09-23 MED ORDER — DOCUSATE SODIUM 100 MG PO CAPS: 100.0000 mg | ORAL_CAPSULE | Freq: Two times a day (BID) | ORAL | Status: DC | PRN

## 2016-09-23 NOTE — Progress Notes (Addendum)
[id number]: bedpan placed under buttocks. Instructed pt to call our when done  0530: pt can NOT void in bedpan. Tech informed pt refusing and wants to get out of bed to void in the bathroom. Pt can NOT barely move legs.   [id number]: Lab at bs.   0550: provider notified for MgSO4 level. Orders received to draw labs.   [id number]: MD in unit. Ordered to d/c MgSo34.

## 2016-09-23 NOTE — Progress Notes (Signed)
HD#2 Admission for postpartum eclampsia, s/p uncomplicated SVD on 6/43/32.  Subjective: Patient reports tolerating PO and no problems voiding.  Does report feeling tired and lightheaded; attributed to magnesium sulfate.  Level was checked this morning and was normal at 6.7. No further seizure activity.  Objective: I have reviewed patient's vital signs, intake and output, medications and labs. Patient Vitals for the past 24 hrs:  BP Temp Temp src Pulse Resp SpO2 Height Weight  09/23/16 0413 130/86 98.4 F (36.9 C) Oral 75 14 100 % - -  09/22/16 2329 132/87 98.4 F (36.9 C) Oral 75 16 100 % - -  09/22/16 1947 124/74 98.7 F (37.1 C) Oral 77 16 100 % - -  09/22/16 1900 - - - - 16 - - -  09/22/16 1800 - - - - 16 - - -  09/22/16 1700 - - - - 16 - - -  09/22/16 1631 (!) 147/89 98.1 F (36.7 C) Oral 91 16 100 % - -  09/22/16 1500 - - - - 16 - - -  09/22/16 1400 138/85 - - 88 16 100 % - -  09/22/16 1315 (!) 151/89 97.9 F (36.6 C) - 82 15 99 % - -  09/22/16 1201 140/90 - - 99 15 100 % - -  09/22/16 1100 136/86 - - 77 16 100 % - -  09/22/16 1000 133/89 98 F (36.7 C) Oral 72 16 99 % - -  09/22/16 0900 (!) 141/88 97.7 F (36.5 C) Oral 79 16 99 % - -  09/22/16 0800 (!) 141/88 98.5 F (36.9 C) Oral 88 16 99 % 5' 1"  (1.549 m) 131 lb (59.4 kg)  09/22/16 0730 137/88 - - 72 11 96 % - -    General: alert and no distress Resp: clear to auscultation bilaterally Cardio: regular rate and rhythm GI: soft, non-tender; bowel sounds normal; no masses,  no organomegaly Extremities: extremities normal, atraumatic, no cyanosis or edema, Homans sign is negative, no sign of DVT and trace BLE edema Vaginal Bleeding: minimal  Results for orders placed or performed during the hospital encounter of 09/22/16 (from the past 72 hour(s))  Comprehensive metabolic panel     Status: Abnormal   Collection Time: 09/22/16  3:57 AM  Result Value Ref Range   Sodium 141 135 - 145 mmol/L   Potassium 3.4 (L) 3.5 - 5.1  mmol/L   Chloride 109 101 - 111 mmol/L   CO2 25 22 - 32 mmol/L   Glucose, Bld 66 65 - 99 mg/dL   BUN 6 6 - 20 mg/dL   Creatinine, Ser 0.90 0.44 - 1.00 mg/dL   Calcium 8.5 (L) 8.9 - 10.3 mg/dL   Total Protein 6.8 6.5 - 8.1 g/dL   Albumin 2.9 (L) 3.5 - 5.0 g/dL   AST 22 15 - 41 U/L   ALT 14 14 - 54 U/L   Alkaline Phosphatase 77 38 - 126 U/L   Total Bilirubin 0.8 0.3 - 1.2 mg/dL   GFR calc non Af Amer >60 >60 mL/min   GFR calc Af Amer >60 >60 mL/min    Comment: (NOTE) The eGFR has been calculated using the CKD EPI equation. This calculation has not been validated in all clinical situations. eGFR's persistently <60 mL/min signify possible Chronic Kidney Disease.    Anion gap 7 5 - 15  CBC with Differential/Platelet     Status: Abnormal   Collection Time: 09/22/16  3:57 AM  Result Value Ref Range  WBC 5.6 4.0 - 10.5 K/uL   RBC 3.76 (L) 3.87 - 5.11 MIL/uL   Hemoglobin 11.4 (L) 12.0 - 15.0 g/dL   HCT 34.0 (L) 36.0 - 46.0 %   MCV 90.4 78.0 - 100.0 fL   MCH 30.3 26.0 - 34.0 pg   MCHC 33.5 30.0 - 36.0 g/dL   RDW 13.5 11.5 - 15.5 %   Platelets 304 150 - 400 K/uL   Neutrophils Relative % 58 %   Neutro Abs 3.2 1.7 - 7.7 K/uL   Lymphocytes Relative 34 %   Lymphs Abs 1.9 0.7 - 4.0 K/uL   Monocytes Relative 7 %   Monocytes Absolute 0.4 0.1 - 1.0 K/uL   Eosinophils Relative 1 %   Eosinophils Absolute 0.0 0.0 - 0.7 K/uL   Basophils Relative 0 %   Basophils Absolute 0.0 0.0 - 0.1 K/uL  Type and screen [place]     Status: None   Collection Time: 09/22/16  5:48 AM  Result Value Ref Range   ABO/RH(D) O POS    Antibody Screen NEG    Sample Expiration 09/25/2016   ABO/Rh     Status: None   Collection Time: 09/22/16  5:48 AM  Result Value Ref Range   ABO/RH(D) O POS   Type and screen [place]     Status: None   Collection Time: 09/22/16  8:39 AM  Result Value Ref Range   ABO/RH(D) O POS    Antibody Screen NEG    Sample Expiration  09/25/2016   CBC     Status: Abnormal   Collection Time: 09/23/16  5:48 AM  Result Value Ref Range   WBC 6.0 4.0 - 10.5 K/uL   RBC 3.65 (L) 3.87 - 5.11 MIL/uL   Hemoglobin 11.4 (L) 12.0 - 15.0 g/dL   HCT 32.8 (L) 36.0 - 46.0 %   MCV 89.9 78.0 - 100.0 fL   MCH 31.2 26.0 - 34.0 pg   MCHC 34.8 30.0 - 36.0 g/dL   RDW 13.9 11.5 - 15.5 %   Platelets 284 150 - 400 K/uL  Comprehensive metabolic panel     Status: Abnormal   Collection Time: 09/23/16  5:48 AM  Result Value Ref Range   Sodium 137 135 - 145 mmol/L   Potassium 3.0 (L) 3.5 - 5.1 mmol/L   Chloride 105 101 - 111 mmol/L   CO2 25 22 - 32 mmol/L   Glucose, Bld 93 65 - 99 mg/dL   BUN 6 6 - 20 mg/dL   Creatinine, Ser 0.74 0.44 - 1.00 mg/dL   Calcium 5.9 (LL) 8.9 - 10.3 mg/dL    Comment: CRITICAL RESULT CALLED TO, READ BACK BY AND VERIFIED WITH: BARNS,J. @0646  ON 09/23/2016 BY BOVELL,A.    Total Protein 5.5 (L) 6.5 - 8.1 g/dL   Albumin 2.3 (L) 3.5 - 5.0 g/dL   AST 15 15 - 41 U/L   ALT 10 (L) 14 - 54 U/L   Alkaline Phosphatase 73 38 - 126 U/L   Total Bilirubin 0.4 0.3 - 1.2 mg/dL   GFR calc non Af Amer >60 >60 mL/min   GFR calc Af Amer >60 >60 mL/min    Comment: (NOTE) The eGFR has been calculated using the CKD EPI equation. This calculation has not been validated in all clinical situations. eGFR's persistently <60 mL/min signify possible Chronic Kidney Disease.    Anion gap 7 5 - 15  Magnesium     Status: Abnormal  Collection Time: 09/23/16  5:48 AM  Result Value Ref Range   Magnesium 6.7 (HH) 1.7 - 2.4 mg/dL    Comment: CRITICAL RESULT CALLED TO, READ BACK BY AND VERIFIED WITH: MCCLELLAN,J. @0657  ON 09/23/2016 BY BOVELL,A.     Assessment/Plan: Postpartum Eclampsia - Stable BP, no meds required for now. No further seizures. Stable labs.   - Magnesium sulfate discontinued; will observe for 24 hours post magnesium sulfate and may need to start antihypertensives - K is 3.0, oral repletion ordered - Continue close  observation.    LOS: 1 day    Jamie Stevenson 09/23/2016, 7:02 AM

## 2016-09-23 NOTE — Progress Notes (Signed)
Calcium level 5.9 reported to nurse Olena LeatherwoodJaneen

## 2016-09-23 NOTE — Lactation Note (Signed)
Lactation Consultation Note  Patient Name: Jamie LernerDeja Arseneau Today's Date: 09/23/2016   Follow up with mom of High Risk OB. Mom reports pumping is going well although she has only pumped once in the last 24 hours. She denies engorgement and says she does not feel full. [facility] at [facility]. Follow up PRN.      Maternal Data    Feeding    LATCH Score/Interventions                      Lactation Tools Discussed/Used     Consult Status      Ed BlalockSharon S Afsa Meany 09/23/2016, 10:48 AM

## 2016-09-24 MED ORDER — IBUPROFEN 600 MG PO TABS
600.0000 mg | ORAL_TABLET | Freq: Four times a day (QID) | ORAL | Status: DC | PRN
  Administered 2016-09-24: 600 mg via ORAL
  Filled 2016-09-24: qty 1

## 2016-09-24 MED ORDER — NIFEDIPINE 10 MG PO CAPS
10.0000 mg | ORAL_CAPSULE | Freq: Once | ORAL | Status: AC
Start: 2016-09-24 — End: 2016-09-24
  Administered 2016-09-24: 10 mg via ORAL
  Filled 2016-09-24: qty 1

## 2016-09-24 MED ORDER — AMLODIPINE BESYLATE 10 MG PO TABS: 10.0000 mg | ORAL_TABLET | Freq: Every day | ORAL | Status: DC

## 2016-09-24 MED ORDER — AMLODIPINE BESYLATE 10 MG PO TABS: 10.0000 mg | ORAL_TABLET | Freq: Every day | ORAL | 3 refills | Status: AC

## 2016-09-24 MED ORDER — IBUPROFEN 600 MG PO TABS: 600.0000 mg | ORAL_TABLET | Freq: Four times a day (QID) | ORAL | 3 refills | Status: AC | PRN

## 2016-09-24 MED ORDER — AMLODIPINE BESYLATE 10 MG PO TABS
10.0000 mg | ORAL_TABLET | Freq: Every day | ORAL | Status: DC
  Administered 2016-09-24: 10 mg via ORAL
  Filled 2016-09-24: qty 1

## 2016-09-24 NOTE — Progress Notes (Signed)
Pt out in wheelchair teaching complete  

## 2016-09-24 NOTE — Progress Notes (Signed)
Patient with elevated BP now and headaches; BP now elevated in severe range.   Patient Vitals for the past 24 hrs:  BP Temp Temp src Pulse Resp SpO2  09/24/16 1316 (!) 160/100 - - 60 - 99 %  09/24/16 1200 (!) 170/82 - - 64 16 100 %  09/24/16 0900 124/83 99.3 F (37.4 C) Oral 66 16 99 %  09/24/16 0400 136/80 99 F (37.2 C) Oral 80 16 99 %  09/23/16 2315 134/82 99.2 F (37.3 C) Oral 70 16 99 %  09/23/16 2015 - - - - 17 97 %  09/23/16 1925 (!) 148/97 98.5 F (36.9 C) Oral 65 18 94 %  09/23/16 1548 127/87 99.4 F (37.4 C) Oral 75 16 100 %   Nifedipine IR 10 mg given for now, will recheck BP in 20 minutes. If still elevated >160/110, will give additional 10 mg. Will continue to monitor.  Tylenol given for headache. Will continue to monitor closely, especially given her recent eclamptic seizures. Seizure precautions instituted. Patient disposition on hold for now.   Tereso NewcomerUgonna A Myrel Rappleye, MD

## 2016-09-24 NOTE — Discharge Instructions (Signed)
Buy a blood pressure cuff and check your blood pressures 2-3 times per day after resting for 10-15 minutes prior.  Call us for any values above 150 over 100   Contact a health care provider if:  You gain more weight than expected.  You have headaches.  You have nausea or vomiting.  You have abdominal pain.  You feel dizzy or light-headed. Get help right away if:  You develop sudden or severe swelling anywhere in your body. This usually happens in the legs.  You gain 5 lbs (2.3 kg) or more during one week.  You have severe:  Abdominal pain.  Headaches.  Dizziness.  Vision problems.  Confusion.  Nausea or vomiting.  You have a seizure.  You have trouble moving any part of your body.  You develop numbness in any part of your body.  You have trouble speaking.  You have any abnormal bleeding.  You pass out. This information is not intended to replace advice given to you by your health care provider. Make sure you discuss any questions you have with your health care provider. Document Released: 07/30/2000 Document Revised: 03/30/2016 Document Reviewed: 03/08/2016 Elsevier Interactive Patient Education  2017 [employer]

## 2016-09-24 NOTE — Discharge Summary (Addendum)
Discharge Summary   Admit Date: 09/22/2016 Discharge Date: 09/24/2016 Discharging Service: Obstetrics  Primary OBGYN: [facility] Admitting Physician: Tereso Newcomer, MD  Discharge Physician: Vergie Living, MD and Tereso Newcomer, MD  Referring Provider: [facility]  Primary Care Provider: No primary care provider on file.  Admission Diagnoses: Postpartum eclampsia  Discharge Diagnoses: Same  Consult Orders: None   Surgeries/Procedures Performed: None  History and Physical: OB Faculty Practice History and Physical  Jamie Stevenson ZOX:[id number] DOB: Apr 09, 1991 DOA: 09/22/2016  Referring physician: Dr Bebe Shaggy, ED physician Prenatal care Office: [facility]  Outpatient Specialists: none  Patient Coming From: home  Chief Complaint: Convulsions at home  HPI: Jamie Stevenson is a 26 y.o. female [id number] who is 11 days postpartum from a viable, preterm, spontaneous vaginal delivery at [redacted]w[redacted]d. Postpartum period complicated by confirmed influenza, but patient returned home two days postpartum without any other complications. Patient presented to ED via EMS after husband witnessed generalized convulsions at home. Seizures were self-limiting, patient post-ictal at time of arrival in ED. Patient possibly had seizure the night before, as she woke up with a sore tongue, having bitten it in her sleep. No palliating or provoking factors.  Emergency Department Course: CBC, CMP collected. Magnesium 6g load started in ED with 2g per hour continuously per my instructions.  Review of Systems:   Pt complains of HA and scotoma yesterday. Improved now.  Pt denies any fevers, chills, nausea, vomiting, diarrhea, constipation, abdominal pain, shortness of breath, dyspnea on exertion, orthopnea, cough, wheezing, palpitations, headache, vision changes, lightheadedness, dizziness, melena, rectal bleeding.  Review of systems are otherwise  negative      Past Medical History:  Diagnosis Date  . Depression         Past Surgical History:  Procedure Laterality Date  . NO PAST SURGERIES     Social History:  reports that she has been smoking Cigarettes.  She has been smoking about 0.25 packs per day. She has never used smokeless tobacco. She reports that she uses drugs, including Marijuana. She reports that she does not drink alcohol. Patient lives at home  No Known Allergies       Family History  Problem Relation Age of Onset  . Diabetes Mother       Prior to Admission medications   Medication Sig Start Date End Date Taking? Authorizing Provider  diphenhydramine-acetaminophen (TYLENOL PM) 25-500 MG TABS tablet Take 1 tablet by mouth as needed (pain/sleep).    Yes Historical Provider, MD  ibuprofen (ADVIL,MOTRIN) 600 MG tablet Take 1 tablet (600 mg total) by mouth every 6 (six) hours. 09/13/16  Yes Lorne Skeens, MD  Prenatal Multivit-Min-Fe-FA (PRENATAL VITAMINS) 0.8 MG tablet Take 1 tablet by mouth daily. 08/18/16  Yes Aviva Signs, CNM  acetaminophen (TYLENOL) 325 MG tablet Take 2 tablets (650 mg total) by mouth every 4 (four) hours as needed (for pain scale < 4). 09/13/16   Lorne Skeens, MD    Physical Exam: BP (!) 148/103 (BP Location: Right Arm)   Pulse 73   Resp 13   Ht 5\' 1"  (1.549 m)   Wt 131 lb (59.4 kg)   LMP 01/28/2016   SpO2 96%   BMI 24.75 kg/m    General: Young black female. Awake and alert and oriented x3. No acute cardiopulmonary distress.   HEENT: Normocephalic atraumatic.  Right and left ears normal in appearance.  Pupils equal, round, reactive to light. Extraocular muscles are  intact. Sclerae anicteric and noninjected.  Moist mucosal membranes. No mucosal lesions.   Neck: Neck supple without lymphadenopathy. No carotid bruits. No masses palpated.   Cardiovascular: Regular rate with normal S1-S2 sounds. No murmurs, rubs, gallops auscultated. No JVD.    Respiratory: Good respiratory effort with no wheezes, rales, rhonchi. Lungs clear to auscultation bilaterally.  No accessory muscle use.  Abdomen: Soft, nontender, nondistended. Active bowel sounds. No masses or hepatosplenomegaly   Skin: No rashes, lesions, or ulcerations.  Dry, warm to touch. 2+ dorsalis pedis and radial pulses.  Musculoskeletal: No calf or leg pain. All major joints not erythematous nontender.  No upper or lower joint deformation.  Good ROM.  No contractures   Psychiatric: Intact judgment and insight. Pleasant and cooperative.  Neurologic: No focal neurological deficits. Strength is 5/5 and symmetric in upper and lower extremities.  Cranial nerves II through XII are grossly intact.           Labs on Admission: I have personally reviewed following labs and imaging studies  CBC:  LastLabs   Recent Labs Lab 09/22/16 0357  WBC 5.6  NEUTROABS 3.2  HGB 11.4*  HCT 34.0*  MCV 90.4  PLT 304     Basic Metabolic Panel:  LastLabs   Recent Labs Lab 09/22/16 0357  NA 141  K 3.4*  CL 109  CO2 25  GLUCOSE 66  BUN 6  CREATININE 0.90  CALCIUM 8.5*     GFR: CrCl cannot be calculated (No order found.). Liver Function Tests:  LastLabs   Recent Labs Lab 09/22/16 0357  AST 22  ALT 14  ALKPHOS 77  BILITOT 0.8  PROT 6.8  ALBUMIN 2.9*     LastLabs  No results for input(s): LIPASE, AMYLASE in the last 168 hours.   LastLabs  No results for input(s): AMMONIA in the last 168 hours.   Coagulation Profile: LastLabs  No results for input(s): INR, PROTIME in the last 168 hours.   Cardiac Enzymes: LastLabs  No results for input(s): CKTOTAL, CKMB, CKMBINDEX, TROPONINI in the last 168 hours.   BNP (last 3 results) RecentLabs(withinlast365days)  No results for input(s): PROBNP in the last 8760 hours.   HbA1C: RecentLabs(last2labs)  No results for input(s): HGBA1C in the last 72 hours.   CBG: LastLabs   No results for input(s): GLUCAP in the last 168 hours.   Lipid Profile: RecentLabs(last2labs)  No results for input(s): CHOL, HDL, LDLCALC, TRIG, CHOLHDL, LDLDIRECT in the last 72 hours.   Thyroid Function Tests: RecentLabs(last2labs)  No results for input(s): TSH, T4TOTAL, FREET4, T3FREE, THYROIDAB in the last 72 hours.   Anemia Panel: RecentLabs(last2labs)  No results for input(s): VITAMINB12, FOLATE, FERRITIN, TIBC, IRON, RETICCTPCT in the last 72 hours.   Urine analysis: Labs(Brief)          Component Value Date/Time   COLORURINE YELLOW 09/10/2016 2100   APPEARANCEUR CLOUDY (A) 09/10/2016 2100   LABSPEC 1.008 09/10/2016 2100   PHURINE 6.0 09/10/2016 2100   GLUCOSEU NEGATIVE 09/10/2016 2100   HGBUR LARGE (A) 09/10/2016 2100   BILIRUBINUR NEGATIVE 09/10/2016 2100   KETONESUR 20 (A) 09/10/2016 2100   PROTEINUR 100 (A) 09/10/2016 2100   UROBILINOGEN 2.0 (H) 09/09/2016 1450   NITRITE NEGATIVE 09/10/2016 2100   LEUKOCYTESUR MODERATE (A) 09/10/2016 2100     Sepsis Labs: @LABRCNTIP (procalcitonin:4,lacticidven:4) )No results found for this or any previous visit (from the past 240 hour(s)).   Radiological Exams on Admission: ImagingResults(Last48hours)  No results found.    Assessment/Plan: Active Problems:  Eclampsia    This patient was discussed with the ED physician, including pertinent vitals, physical exam findings, labs, and imaging.  We also discussed care given by the ED provider.  #1 Eclampsia   CMP, CBC stable  BP not in severe range currently.  Continue magnesium for 24 hours, the observe of magnesium for BP  Repeat CBC, CMP tomorrow morning  Routine postpartum care otherwise.   Levie HeritageJacob J Stinson, DO Faculty Practice Attending [facility]  Hospital Course: Patient had 24 hours of postpartum magnesium and her surveillance labs were all normal. Her  BPs were normal to just mild range and  she had no s/s and negative physical exam. She was not on BP medications after the magnesium was stopped. However, on the day of discharge, she developed severe range BP with headaches. This was successfully treated with Nifedipine IR 10 mg po x 1; and started on Amlodipine. She was observed for several hours and was stable.  She was deemed stable for discharge to home with outpatient follow up.   Discharge Exam:     Current Vital Signs 24h Vital Sign Ranges  T 98.7 F (37.1 C) Temp  Avg: 98.9 F (37.2 C)  Min: 98.5 F (36.9 C)  Max: 99.3 F (37.4 C)  BP 126/75 BP  Min: 124/83  Max: 170/82  HR 88 Pulse  Avg: 70.4  Min: 60  Max: 88  RR 16 Resp  Avg: 16.4  Min: 16  Max: 18  SaO2 97 % Not Delivered SpO2  Avg: 98 %  Min: 94 %  Max: 100 %       24 Hour I/O Current Shift I/O  Time Ins Outs 02/08 0701 - 02/09 0700 In: 770 [P.O.:770] Out: 2000 [Urine:2000] No intake/output data recorded.    Patient Vitals for the past 24 hrs:  BP Temp Temp src Pulse Resp SpO2  09/24/16 1637 126/75 98.7 F (37.1 C) - 88 16 97 %  09/24/16 1406 (!) 135/111 - - - - -  09/24/16 1316 (!) 160/100 - - 60 - 99 %  09/24/16 1200 (!) 170/82 - - 64 16 100 %  09/24/16 0900 124/83 99.3 F (37.4 C) Oral 66 16 99 %  09/24/16 0400 136/80 99 F (37.2 C) Oral 80 16 99 %  09/23/16 2315 134/82 99.2 F (37.3 C) Oral 70 16 99 %  09/23/16 2015 - - - - 17 97 %  09/23/16 1925 (!) 148/97 98.5 F (36.9 C) Oral 65 18 94 %    General appearance: Well nourished, well developed female in no acute distress.  Neck:  Supple, normal appearance, and no thyromegaly  Cardiovascular: S1, S2 normal, no murmur, rub or gallop, regular rate and rhythm Respiratory:  Clear to auscultation bilateral. Normal respiratory effort Abdomen: positive bowel sounds and no masses, hernias; diffusely non tender to palpation, non distended Neuro/Psych:  Normal mood and affect.  Skin:  Warm and dry.  Neuro: 1+ brachial  Discharge Disposition:   Home  Patient Instructions:  Standard. Patient advised to buy BP cuff for home surveillance   Results Pending at Discharge:  none  Discharge Medications: Allergies as of 09/24/2016   No Known Allergies     Medication List    TAKE these medications   acetaminophen 325 MG tablet Commonly known as:  TYLENOL Take 2 tablets (650 mg total) by mouth every 4 (four) hours as needed (for pain scale < 4).   amLODipine 10 MG tablet Commonly known as:  NORVASC Take 1 tablet (10 mg total) by mouth daily. Start taking on:  09/25/2016   diphenhydramine-acetaminophen 25-500 MG Tabs tablet Commonly known as:  TYLENOL PM Take 1 tablet by mouth as needed (pain/sleep).   ibuprofen 600 MG tablet Commonly known as:  ADVIL,MOTRIN Take 1 tablet (600 mg total) by mouth every 6 (six) hours. What changed:  Another medication with the same name was added. Make sure you understand how and when to take each.   ibuprofen 600 MG tablet Commonly known as:  ADVIL,MOTRIN Take 1 tablet (600 mg total) by mouth every 6 (six) hours as needed for fever or headache. What changed:  You were already taking a medication with the same name, and this prescription was added. Make sure you understand how and when to take each.   Prenatal Vitamins 0.8 MG tablet Take 1 tablet by mouth daily.        Future Appointments Date Time Provider Department Center  09/30/2016 11:15 AM [facility] NURSE [facility]  10/21/2016 8:40 AM Kathlene Cote [facility]  Patient has 2/15 BP check  Cornelia Copa. MD and Tereso Newcomer, MD Attendings [facility]) (Original discharge summary by Dr.Pickens was addended by Dr. Macon Large)

## 2016-09-24 NOTE — Progress Notes (Signed)
BP now 135/111.  Will start patient on Amlodipine 10 mg daily. Reevaluate BP in 30 minutes.    If BP stable and headache improved, will send home on Amlodipine. If not, will continue to observe closely.  Tereso NewcomerUgonna A Hildegarde Dunaway, MD

## 2016-09-27 ENCOUNTER — Encounter: Payer: Self-pay | Admitting: Family Medicine

## 2016-09-30 ENCOUNTER — Ambulatory Visit: Payer: Self-pay

## 2016-10-21 ENCOUNTER — Ambulatory Visit: Payer: No Typology Code available for payment source | Admitting: Medical

## 2016-10-21 ENCOUNTER — Encounter: Payer: Self-pay | Admitting: Family Medicine

## 2016-10-26 ENCOUNTER — Ambulatory Visit: Payer: No Typology Code available for payment source | Admitting: Student

## 2016-11-04 ENCOUNTER — Ambulatory Visit (INDEPENDENT_AMBULATORY_CARE_PROVIDER_SITE_OTHER): Payer: No Typology Code available for payment source | Admitting: Advanced Practice Midwife

## 2016-11-04 ENCOUNTER — Encounter: Payer: Self-pay | Admitting: Neurology

## 2016-11-04 ENCOUNTER — Encounter: Payer: Self-pay | Admitting: Advanced Practice Midwife

## 2016-11-04 DIAGNOSIS — R413 Other amnesia: Secondary | ICD-10-CM

## 2016-11-04 MED ORDER — NORETHINDRONE 0.35 MG PO TABS: 1.0000 | ORAL_TABLET | Freq: Every day | ORAL | 11 refills | Status: AC

## 2016-11-04 NOTE — Progress Notes (Signed)
Pt referred to [facility]. Called the office and they stated that they will contact the patient with appointment information. Pt aware.

## 2016-11-04 NOTE — Progress Notes (Signed)
Subjective:     Jamie LernerDeja Stevenson is a 26 y.o. female who presents for a postpartum visit. She is 7 weeks postpartum following a spontaneous vaginal delivery. I have fully reviewed the prenatal and intrapartum course. The delivery was at 32 gestational weeks. Outcome: spontaneous vaginal delivery. Anesthesia: none. Postpartum course has been remarkable for eclamptic seizure after she went home.   Was treated and sent home on Norvasc.  Has two tablets left.  .Is having memory problems since seizures.  Does say she had some memory problems in years past but worse after seizures.  Thinks she had two seizure episodes at home.  Remembers waking up and drooling, and disoriented.   Baby's course has been uneventful. Only stayed in NICU a few days.  Pecola Leisure. Baby is feeding by both breast and bottle - Similac Neosure. Bleeding pink. Bowel function is normal. Bladder function is normal. Patient is not sexually active. Contraception method is oral progesterone-only contraceptive. Postpartum depression screening: negative.  The following portions of the patient's history were reviewed and updated as appropriate: allergies, current medications, past family history, past medical history, past social history, past surgical history and problem list.  Review of Systems Pertinent items are noted in HPI.   Objective:   Vitals:   11/04/16 1402  BP: 127/82  Pulse: 84     General:  alert, cooperative and no distress   Breasts:  inspection negative, no nipple discharge or bleeding, no masses or nodularity palpable  Lungs: clear to auscultation bilaterally  Heart:  regular rate and rhythm, S1, S2 normal, no murmur, click, rub or gallop  Abdomen: soft, non-tender; bowel sounds normal; no masses,  no organomegaly   Vulva:  not evaluated  Vagina: not evaluated  Cervix:  n/a  Corpus: not examined  Adnexa:  not evaluated  Rectal Exam: Not performed.        Assessment:     Normal postpartum exam. Pap smear not done at  today's visit.   Plan:    1. Contraception: oral progesterone-only contraceptive 2. Undecided about when to start OCPs   Had bleeding problems last time she used them. May wait a month. But willuse condoms. 3. Follow up in: 1 year or as needed.   4.   Will refer to Neurology for memory loss problems.

## 2016-11-04 NOTE — Patient Instructions (Signed)
Oral Contraception Information Oral contraceptive pills (OCPs) are medicines taken to prevent pregnancy. OCPs work by preventing the ovaries from releasing eggs. The hormones in OCPs also cause the cervical mucus to thicken, preventing the sperm from entering the uterus. The hormones also cause the uterine lining to become thin, not allowing a fertilized egg to attach to the inside of the uterus. OCPs are highly effective when taken exactly as prescribed. However, OCPs do not prevent sexually transmitted diseases (STDs). Safe sex practices, such as using condoms along with the pill, can help prevent STDs. Before taking the pill, you may have a physical exam and Pap test. Your health care provider may order blood tests. The health care provider will make sure you are a good candidate for oral contraception. Discuss with your health care provider the possible side effects of the OCP you may be prescribed. When starting an OCP, it can take 2 to 3 months for the body to adjust to the changes in hormone levels in your body. Types of oral contraception  The combination pill-This pill contains estrogen and progestin (synthetic progesterone) hormones. The combination pill comes in 21-day, 28-day, or 91-day packs. Some types of combination pills are meant to be taken continuously (365-day pills). With 21-day packs, you do not take pills for 7 days after the last pill. With 28-day packs, the pill is taken every day. The last 7 pills are without hormones. Certain types of pills have more than 21 hormone-containing pills. With 91-day packs, the first 84 pills contain both hormones, and the last 7 pills contain no hormones or contain estrogen only.  The minipill-This pill contains the progesterone hormone only. The pill is taken every day continuously. It is very important to take the pill at the same time each day. The minipill comes in packs of 28 pills. All 28 pills contain the hormone. Advantages of oral  contraceptive pills  Decreases premenstrual symptoms.  Treats menstrual period cramps.  Regulates the menstrual cycle.  Decreases a heavy menstrual flow.  May treatacne, depending on the type of pill.  Treats abnormal uterine bleeding.  Treats polycystic ovarian syndrome.  Treats endometriosis.  Can be used as emergency contraception. Things that can make oral contraceptive pills less effective OCPs can be less effective if:  You forget to take the pill at the same time every day.  You have a stomach or intestinal disease that lessens the absorption of the pill.  You take OCPs with other medicines that make OCPs less effective, such as antibiotics, certain HIV medicines, and some seizure medicines.  You take expired OCPs.  You forget to restart the pill on day 7, when using the packs of 21 pills. Risks associated with oral contraceptive pills Oral contraceptive pills can sometimes cause side effects, such as:  Headache.  Nausea.  Breast tenderness.  Irregular bleeding or spotting. Combination pills are also associated with a small increased risk of:  Blood clots.  Heart attack.  Stroke. This information is not intended to replace advice given to you by your health care provider. Make sure you discuss any questions you have with your health care provider. Document Released: 10/23/2002 Document Revised: 01/08/2016 Document Reviewed: 01/21/2013 [employer] Interactive Patient Education  2017 [employer]  

## 2016-11-11 ENCOUNTER — Ambulatory Visit: Payer: Self-pay | Admitting: Neurology

## 2017-08-19 IMAGING — US US MFM OB COMP +14 WKS
1 series · 14 of 28 positions shown · non-contrast
Comparison: none

[Series 1: us mfm ob comp +14 wks · 51 acquisitions, 14 frames shown]
[im 2/51]
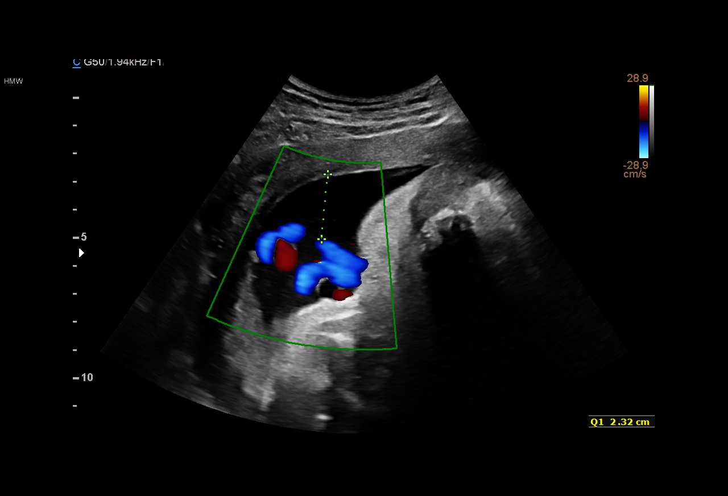
[im 6/51]
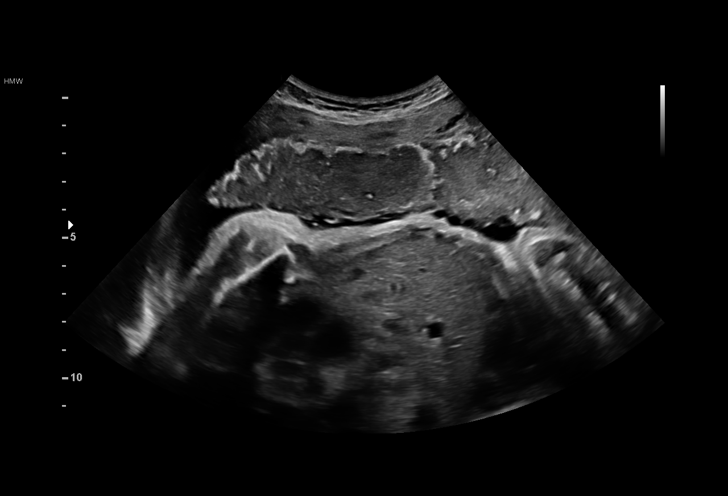
[im 10/51]
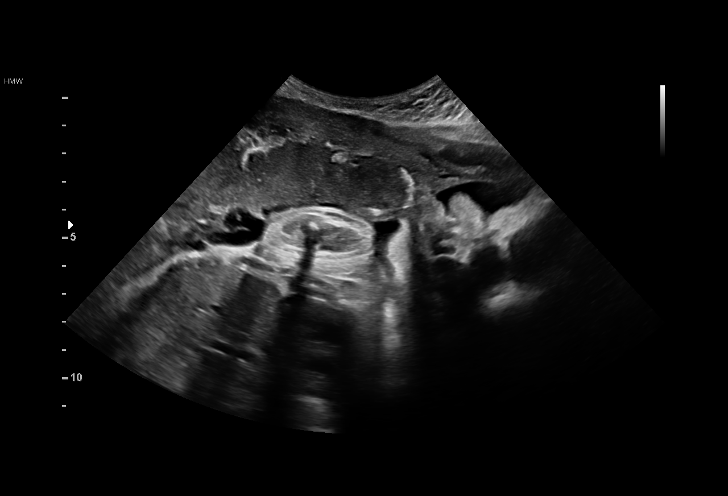
[im 13/51]
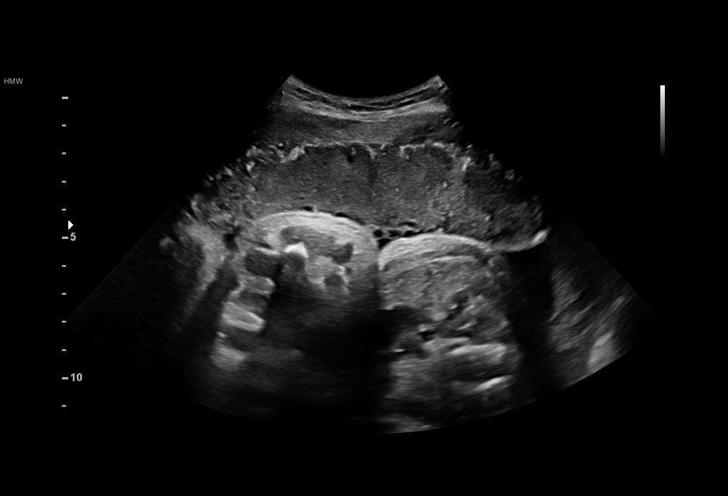
[im 17/51]
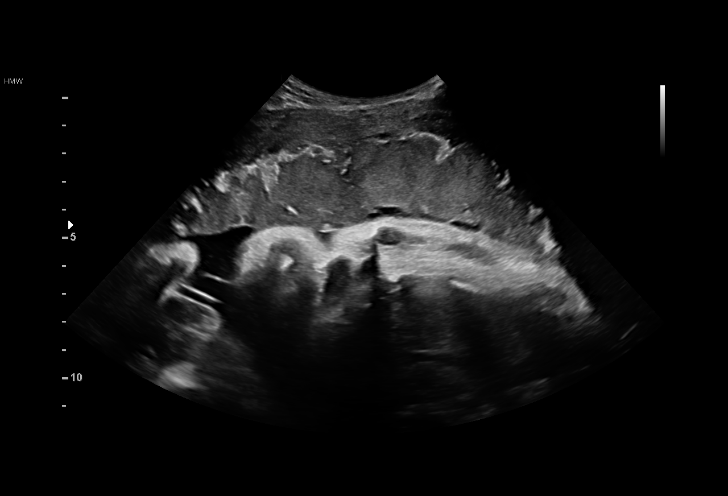
[im 21/51]
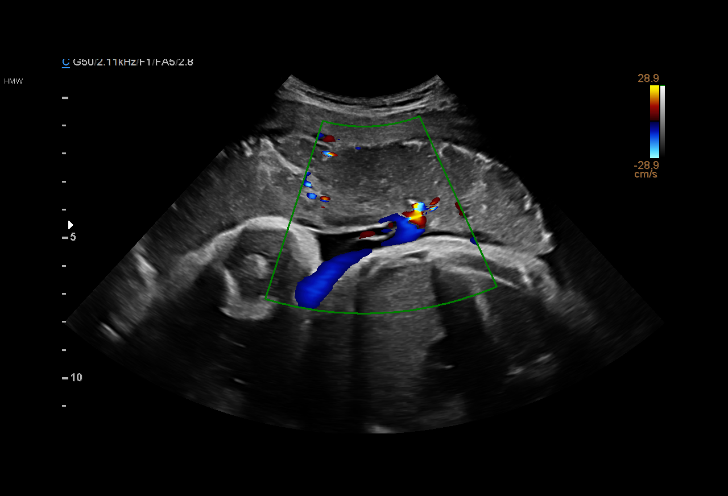
[im 25/51]
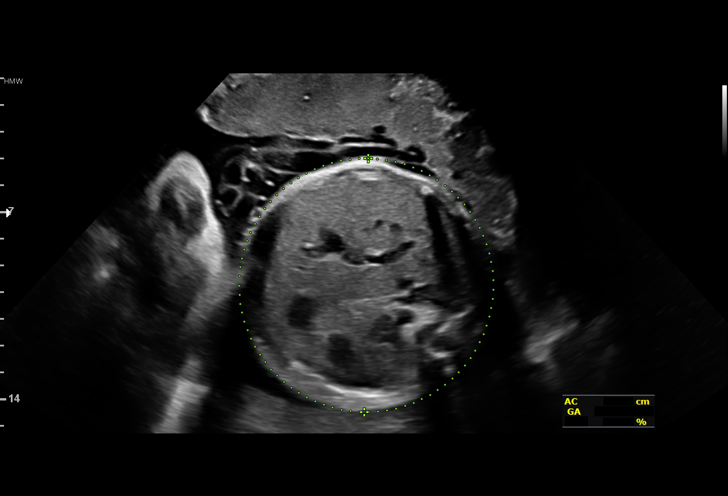
[im 28/51]
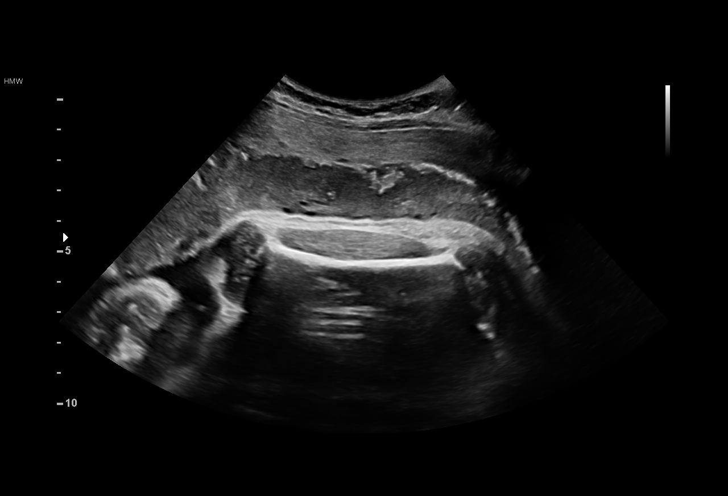
[im 32/51]
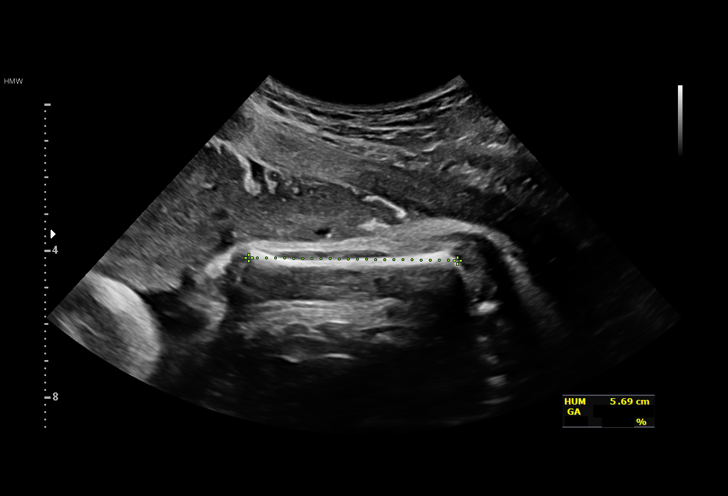
[im 36/51]
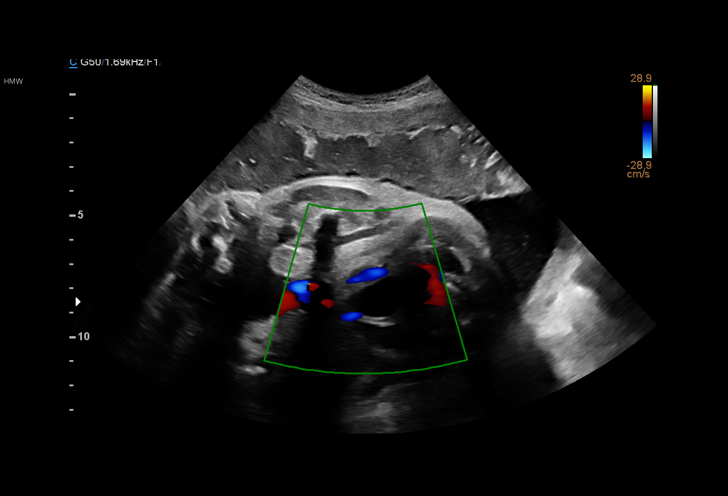
[im 39/51]
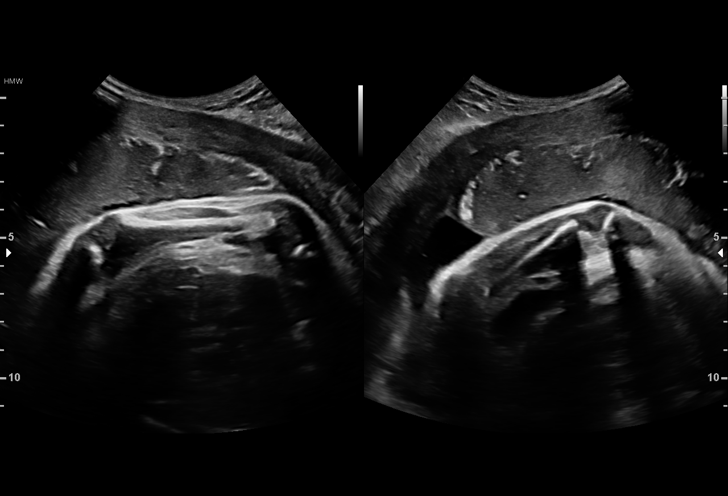
[im 43/51]
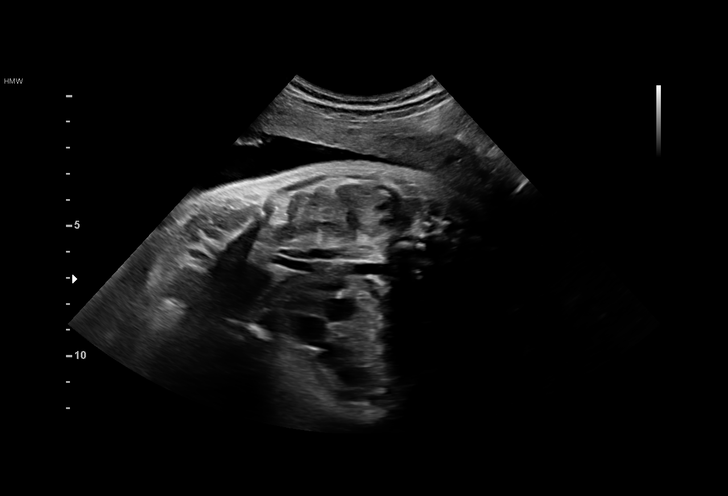
[im 47/51]
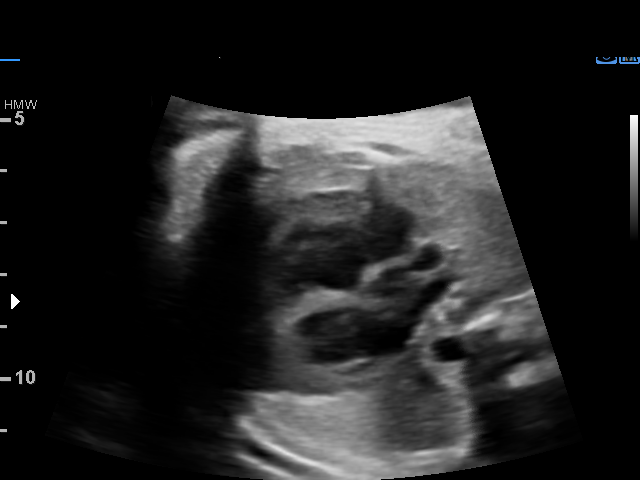
[im 51/51]
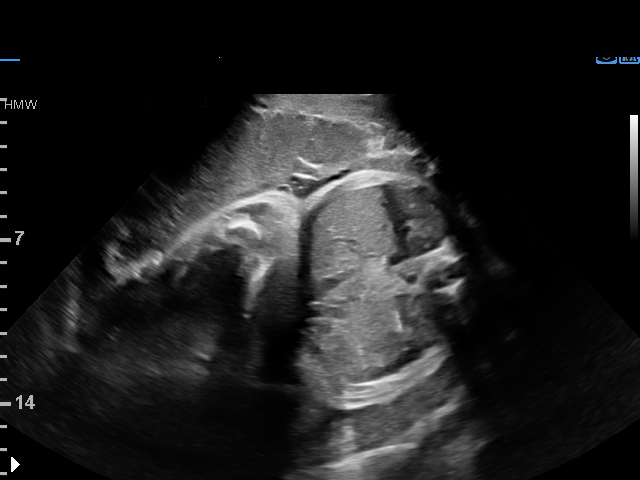

[14 of 28 positions shown; findings below may reference images not displayed]

[facility]
MAU/Triage

1  AKIKO NJUGUNA            [id number]      [id number]     [id number]
Indications

33 weeks gestation of pregnancy
Late to prenatal care, third trimester
Encounter for uncertain dates
Preterm contractions
OB History

Gravidity:    3         Term:   1
TOP:          1         Living: 1
Fetal Evaluation

Num Of Fetuses:     1
Fetal Heart         138
Rate(bpm):
Cardiac Activity:   Observed
Presentation:       Cephalic
Placenta:           Anterior, above cervical os
P. Cord Insertion:  Visualized, central

Amniotic Fluid
AFI FV:      Subjectively within normal limits

AFI Sum(cm)     %Tile       Largest Pocket(cm)
11.53           30

RUQ(cm)       RLQ(cm)       LUQ(cm)        LLQ(cm)
2.32
Biometry

BPD:      83.8  mm     G. Age:  33w 5d         50  %    CI:        75.12   %    70 - 86
FL/HC:       20.4  %    19.4 -
HC:      306.7  mm     G. Age:  34w 1d         29  %    HC/AC:       1.02       0.96 -
AC:      299.5  mm     G. Age:  34w 0d         62  %    FL/BPD:      74.8  %    71 - 87
FL:       62.7  mm     G. Age:  32w 3d         15  %    FL/AC:       20.9  %    20 - 24
HUM:      56.8  mm     G. Age:  33w 0d         46  %

Est. FW:    5584   gm    4 lb 14 oz     57  %
Gestational Age

LMP:           32w 2d        Date:  01/28/16                 EDD:   11/03/16
U/S Today:     33w 4d                                        EDD:   10/25/16
Best:          33w 4d     Det. By:  U/S (09/10/16)           EDD:   10/25/16
Anatomy

Cranium:               Appears normal         Aortic Arch:            Not well visualized
Cavum:                 Not well visualized    Ductal Arch:            Not well visualized
Ventricles:            Not well visualized    Diaphragm:              Not well visualized
Choroid Plexus:        Not well visualized    Stomach:                Appears normal, left
sided
Cerebellum:            Not well visualized    Abdomen:                Appears normal
Posterior Fossa:       Not well visualized    Abdominal Wall:         Not well visualized
Nuchal Fold:           Not well visualized    Cord Vessels:           Appears normal (3
vessel cord)
Face:                  Appears normal         Kidneys:                Appear normal
(orbits and profile)
Lips:                  Not well visualized    Bladder:                Appears normal
Thoracic:              Appears normal         Spine:                  Not well visualized
Heart:                 Not well visualized    Upper Extremities:      Visualized
RVOT:                  Not well visualized    Lower Extremities:      Visualized
LVOT:                  Not well visualized

Other:  Technically difficult due to advanced GA and fetal position.
Cervix Uterus Adnexa

Cervix
Not visualized (advanced GA >45wks)

Uterus
No abnormality visualized.

Left Ovary
No adnexal mass visualized.

Right Ovary
No adnexal mass visualized.

Cul De Sac:   No free fluid seen.

Adnexa:       No abnormality visualized.
Impression

SIUP at 99w8d (remote read of ultrasound only)
active singleton fetus
appropriate growth for GA
no dysmorphic features demonstrated
amniotic fluid volume is gestational age appropriate
limited views as documented above
no previa
Recommendations

Management as clinically indicated.
Suggest return in 4-6 weeks to complete survey and plot
interval growth

## 2017-08-22 IMAGING — CR DG CHEST 2V
2 series · 2 of 2 positions shown · non-contrast
Comparison: 09/11/2016

CLINICAL DATA: Multifocal pneumonia

EXAM:
CHEST  2 VIEW

[chest pa]
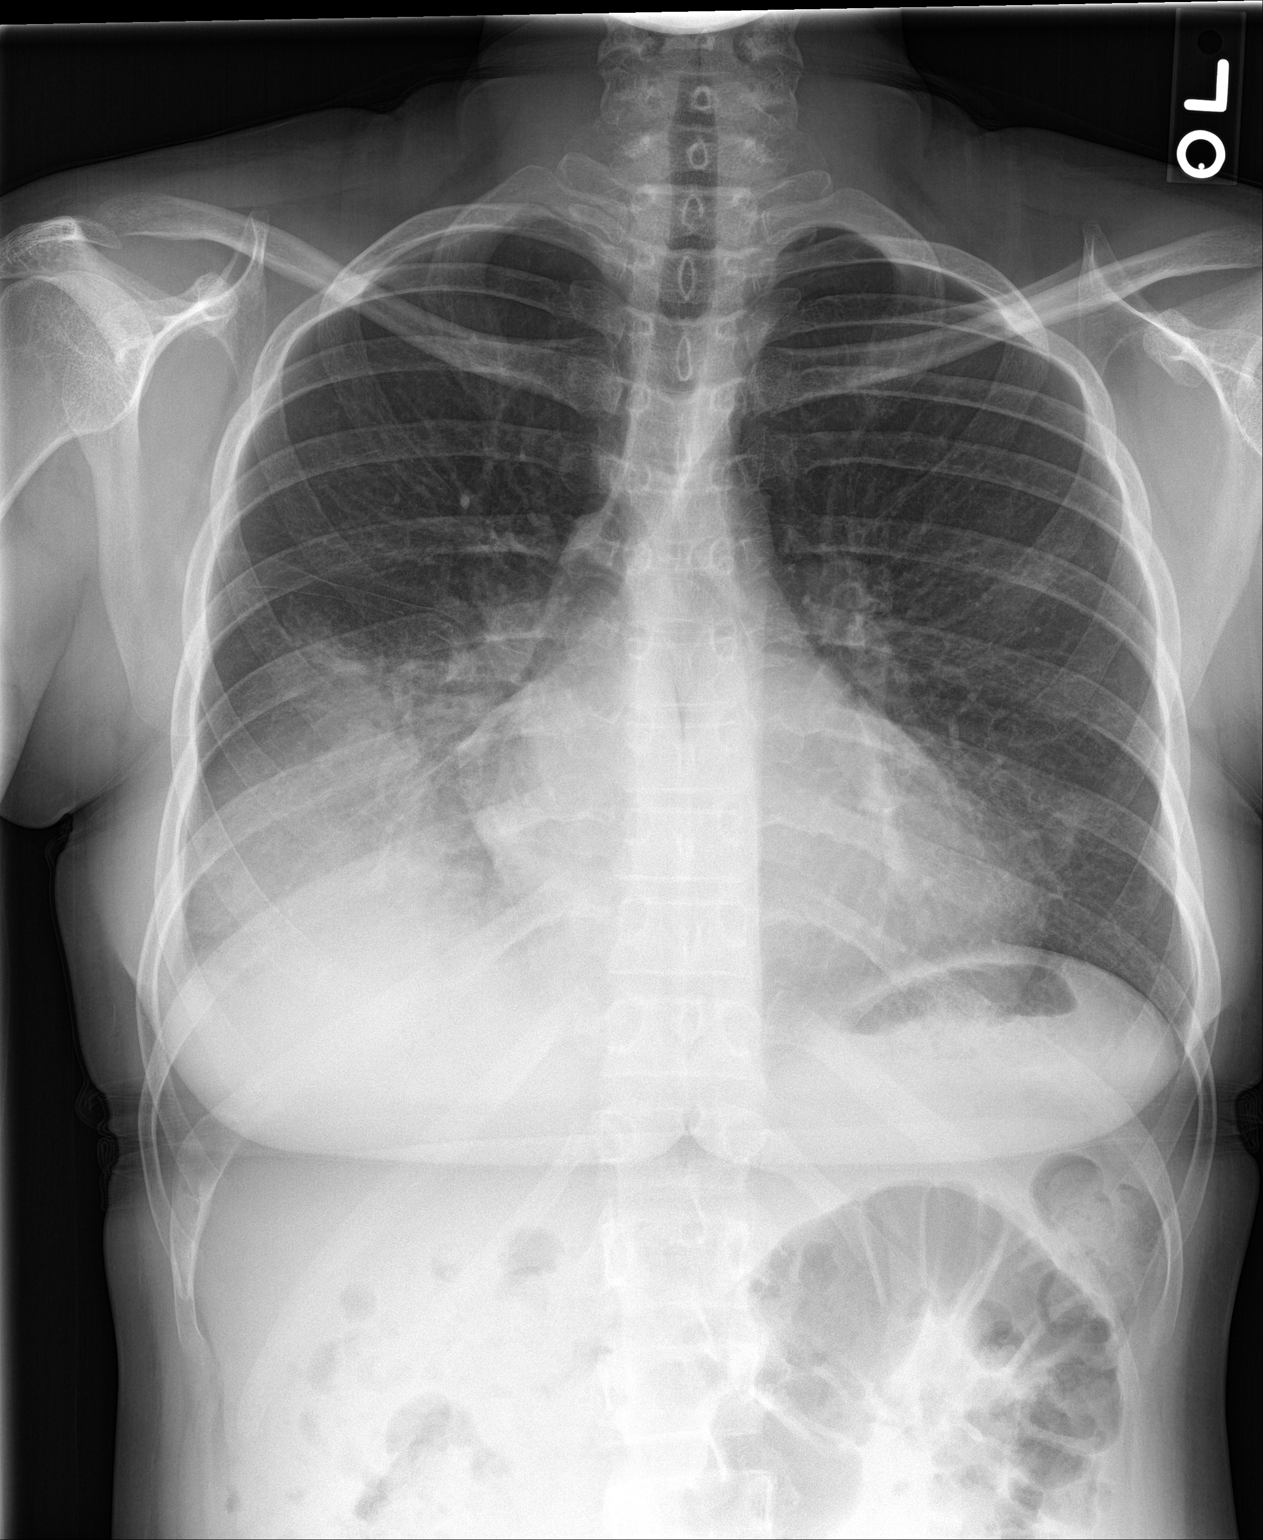

[chest lat]
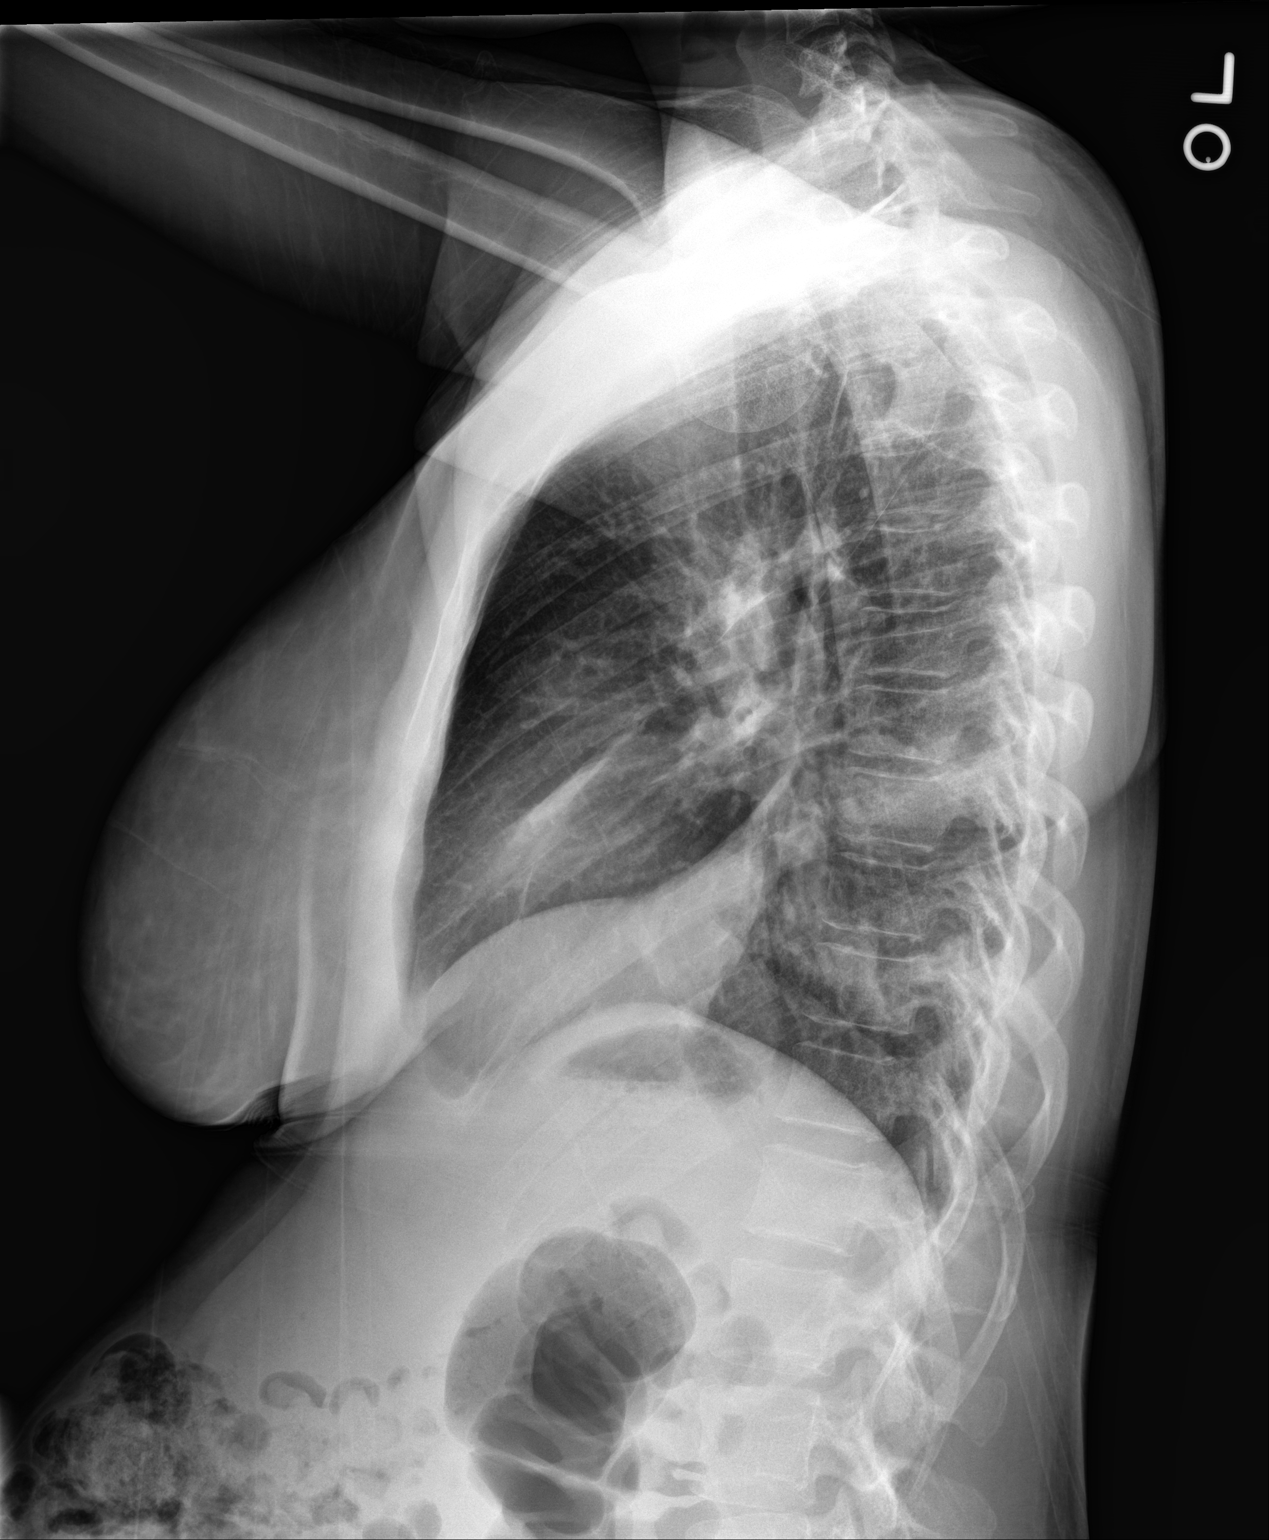

[2 of 2 positions shown; findings below may reference images not displayed]

FINDINGS: Borderline enlargement of cardiac silhouette.

Mediastinal contours and pulmonary vascularity normal.

Persistent RIGHT lower lobe consolidation.

Improved LEFT upper lobe infiltrate with mild residual infiltrate
within lingula and RIGHT middle lobe.

No pleural effusion or pneumothorax.

Bones unremarkable.
IMPRESSION: Improved LEFT upper lobe infiltrate though RIGHT lower lobe
consolidation and additional mild infiltrates in the lingula and
RIGHT middle lobe persist.
